# Patient Record
Sex: Female | Born: 1990 | Race: Black or African American | Hispanic: No | Marital: Single | Smoking: Current some day smoker
Health system: Southern US, Community
[De-identification: ages and names within clinical notes are randomized; demographics above are authoritative.]

## PROBLEM LIST (undated history)

## (undated) ENCOUNTER — Inpatient Hospital Stay (HOSPITAL_COMMUNITY): Payer: Self-pay

## (undated) DIAGNOSIS — Z789 Other specified health status: Secondary | ICD-10-CM

## (undated) DIAGNOSIS — B009 Herpesviral infection, unspecified: Secondary | ICD-10-CM

## (undated) DIAGNOSIS — Z3490 Encounter for supervision of normal pregnancy, unspecified, unspecified trimester: Secondary | ICD-10-CM

## (undated) HISTORY — DX: Encounter for supervision of normal pregnancy, unspecified, unspecified trimester: Z34.90

## (undated) HISTORY — PX: WISDOM TOOTH EXTRACTION: SHX21

## (undated) HISTORY — PX: NO PAST SURGERIES: SHX2092

---
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
## 2011-01-06 LAB — URINALYSIS, ROUTINE W REFLEX MICROSCOPIC
Bilirubin Urine: NEGATIVE
Hgb urine dipstick: NEGATIVE
Ketones, ur: NEGATIVE mg/dL
Specific Gravity, Urine: 1.02 (ref 1.005–1.030)
Urobilinogen, UA: 0.2 mg/dL (ref 0.0–1.0)

## 2011-09-27 ENCOUNTER — Emergency Department (HOSPITAL_COMMUNITY)
Admission: EM | Admit: 2011-09-27 | Discharge: 2011-09-27 | Disposition: A | Discharge status: Home / Self Care | Payer: Medicaid Other | Attending: Emergency Medicine | Admitting: Emergency Medicine

## 2011-09-27 ENCOUNTER — Encounter: Payer: Self-pay | Admitting: *Deleted

## 2011-09-27 DIAGNOSIS — J3489 Other specified disorders of nose and nasal sinuses: Secondary | ICD-10-CM | POA: Insufficient documentation

## 2011-09-27 DIAGNOSIS — K089 Disorder of teeth and supporting structures, unspecified: Secondary | ICD-10-CM | POA: Insufficient documentation

## 2011-09-27 DIAGNOSIS — K047 Periapical abscess without sinus: Secondary | ICD-10-CM | POA: Insufficient documentation

## 2011-09-27 DIAGNOSIS — R6884 Jaw pain: Secondary | ICD-10-CM | POA: Insufficient documentation

## 2011-09-27 MED ORDER — PENICILLIN V POTASSIUM 500 MG PO TABS: 500.0000 mg | ORAL_TABLET | Freq: Four times a day (QID) | ORAL | Status: AC

## 2011-09-27 MED ORDER — HYDROCODONE-ACETAMINOPHEN 5-325 MG PO TABS: 1.0000 | ORAL_TABLET | Freq: Four times a day (QID) | ORAL | Status: AC | PRN

## 2011-09-27 MED ORDER — IBUPROFEN 800 MG PO TABS: 800.0000 mg | ORAL_TABLET | Freq: Three times a day (TID) | ORAL | Status: AC

## 2011-09-27 NOTE — ED Provider Notes (Signed)
History     CSN: [id number]  Arrival date & time 09/27/11  5784   First MD Initiated Contact with Patient 09/27/11 [phone]      Chief Complaint  Patient presents with  . Jaw Pain    (Consider location/radiation/quality/duration/timing/severity/associated sxs/prior treatment) HPI Comments: Patient reports pain and swelling in her left jaw that began yesterday.  States pain is exacerbated by open and closing jaw and eating.  Admits to left upper molar pain recently. Associated pain in left ear.  Noted she had nasal congestion last night.  Denies fevers, sore throat, difficulty swallowing or breathing, recent illness, any trauma to the jaw, abnormal nasal discharge.    The history is provided by the patient.    History reviewed. No pertinent past medical history.  History reviewed. No pertinent past surgical history.  No family history on file.  History  Substance Use Topics  . Smoking status: Current Some Day Smoker  . Smokeless tobacco: Not on file  . Alcohol Use: Yes    OB History    Grav Para Term Preterm Abortions TAB SAB Ect Mult Living                  Review of Systems  Constitutional: Negative for fever and appetite change.  HENT: Negative for neck pain and neck stiffness.   Respiratory: Negative for shortness of breath.   All other systems reviewed and are negative.    Allergies  Review of patient's allergies indicates no known allergies.  Home Medications  No current outpatient prescriptions on file.  BP 115/82  Pulse 89  Temp(Src) 98.6 F (37 C) (Oral)  Resp 18  SpO2 100%  Physical Exam  Nursing note and vitals reviewed. Constitutional: She is oriented to person, place, and time. She appears well-developed and well-nourished.  HENT:  Head: Normocephalic and atraumatic.    Right Ear: Tympanic membrane and external ear normal.  Left Ear: Tympanic membrane and external ear normal.  Nose: Mucosal edema present.  Mouth/Throat: Oropharynx is  clear and moist. No oropharyngeal exudate, posterior oropharyngeal edema or tonsillar abscesses.    Neck: Neck supple.  Cardiovascular: Normal rate and regular rhythm.   Pulmonary/Chest: Effort normal and breath sounds normal. No stridor. No respiratory distress. She has no wheezes. She has no rales.  Lymphadenopathy:    She has no cervical adenopathy.  Neurological: She is alert and oriented to person, place, and time.    ED Course  Procedures (including critical care time)  Labs Reviewed - No data to display No results found.   1. Dental abscess       MDM  Patient p/w 2 days of left upper jaw swelling, pain, adjacent to left upper molar that is tender to percussion and cracked. Afebrile, airway patent.  Treatment for dental abscess, instructions given regarding follow up with on-call dentist within 48 hours.  Pt verbalizes understanding.           Dillard Cannon [place], [place] 09/27/11 [phone]

## 2011-09-27 NOTE — ED Notes (Signed)
Discharge instructions given. Pt educated about follow up care and prescriptions. Informed to not drive with narcotics and also to eat with medications.

## 2011-09-27 NOTE — ED Notes (Signed)
C/o pain in left lower jaw onset swelling during the night

## 2011-09-27 NOTE — ED Notes (Addendum)
Pt stated that she has been having left side jaw pain since yesterday. Pain is 8 out of 10. Pain increase when patient is eating or opening/moving mouth.Will continue to monitor.

## 2011-09-28 NOTE — ED Provider Notes (Signed)
Medical screening examination/treatment/procedure(s) were conducted as a shared visit with non-physician practitioner(s) and myself.  I personally evaluated the patient during the encounter   Joya Gaskins, MD 09/28/11 [phone]

## 2012-03-21 ENCOUNTER — Emergency Department (INDEPENDENT_AMBULATORY_CARE_PROVIDER_SITE_OTHER)
Admission: EM | Admit: 2012-03-21 | Discharge: 2012-03-21 | Disposition: A | Discharge status: Home / Self Care | Payer: Self-pay | Source: Home / Self Care | Attending: Family Medicine | Admitting: Family Medicine

## 2012-03-21 ENCOUNTER — Encounter (HOSPITAL_COMMUNITY): Payer: Self-pay | Admitting: *Deleted

## 2012-03-21 DIAGNOSIS — M7650 Patellar tendinitis, unspecified knee: Secondary | ICD-10-CM

## 2012-03-21 DIAGNOSIS — M7652 Patellar tendinitis, left knee: Secondary | ICD-10-CM

## 2012-03-21 MED ORDER — TRAMADOL HCL 50 MG PO TABS: 50.0000 mg | ORAL_TABLET | Freq: Four times a day (QID) | ORAL | Status: AC | PRN

## 2012-03-21 MED ORDER — IBUPROFEN 600 MG PO TABS: 600.0000 mg | ORAL_TABLET | Freq: Three times a day (TID) | ORAL | Status: AC | PRN

## 2012-03-21 NOTE — ED Notes (Signed)
Pt  Reports   About  5  Days  Ago  She  inj  Her     l  Knee       She  Is ambulatory    And     Alert        She     Has  No  Obvious  Deformity      She      denys  Any other  injurys

## 2012-03-21 NOTE — Discharge Instructions (Signed)
Patellar Tendinitis, Jumper's Knee with Rehab Tendinitis is inflammation of a tendon. Tendonitis of the tendon below the kneecap (patella) is known as patellar tendonitis. Patellar tendonitis is a common cause of pain below the kneecap (infrapatella). Patellar tendonitis may involve a tear (strain) in the ligament. Strains are classified into three categories. Grade 1 strains cause pain, but the tendon is not lengthened. Grade 2 strains include a lengthened ligament, due to the ligament being stretched or partially ruptured. With grade 2 strains there is still function, although function may be decreased. Grade 3 strains involve a complete tear of the tendon or muscle, and function is usually impaired. Patellar tendon strains are usually grade 1 or 2.  SYMPTOMS   Pain, tenderness, swelling, warmth, or redness over the patellar tendon (just below the kneecap).   Pain and loss of strength (sometimes), with forcefully straightening the knee (especially when jumping or rising from a seated or squatting position), or bending the knee completely (squatting or kneeling).   Crackling sound (crepitation) when the tendon is moved or touched.  CAUSES  Patellar tendonitis is caused by injury to the patellar tendon. The inflammation is the body's healing response. Common causes of injury include:  Stress from a sudden increase in intensity, frequency, or duration of training.   Overuse of the quadriceps thigh muscles and patellar tendon.   Direct hit (trauma) to the knee or patellar tendon.  RISK INCREASES WITH:  Sports that require sudden, explosive thigh muscle (quadriceps) contraction, such as jumping, quick starts, or kicking.   Running sports, especially running down hills.   Poor strength and flexibility of the thigh and knee.   Flat feet.  PREVENTION  Warm up and stretch properly before activity.   Allow for adequate recovery between workouts.   Maintain physical fitness:   Strength,  flexibility, and endurance.   Cardiovascular fitness.   Protect the knee joint with taping, protective strapping, bracing, or elastic compression bandage.   Wear arch supports (orthotics).  PROGNOSIS  If treated properly, patellar tendonitis usually heals within 6 weeks.  RELATED COMPLICATIONS   Longer healing time, if not properly treated or if not given enough time to heal.   Recurring symptoms, if activity is resumed too soon, with overuse, with a direct blow, or when using poor technique.   If untreated, tendon rupture requiring surgery.  TREATMENT Treatment first involves the use of ice and medicine, to reduce pain and inflammation. The use of strengthening and stretching exercises may help reduce pain with activity. These exercises may be performed at home or with a therapist. Serious cases of tendonitis may require restraining the knee for 10 to 14 days, to prevent stress on the tendon and to promote healing. Crutches may be used (uncommon) until you can walk without a limp. For cases in which non-surgical treatment is unsuccessful, surgery may be advised, to remove the inflamed tendon lining (sheath). Surgery is rare, and is only advised after at least 6 months of non-surgical treatment. MEDICATION   If pain medicine is needed, nonsteroidal anti-inflammatory medicines (aspirin and ibuprofen), or other minor pain relievers (acetaminophen), are often advised.   Do not take pain medicine for 7 days before surgery.   Prescription pain relievers may be given, if your caregiver thinks they are needed. Use only as directed and only as much as you need.  HEAT AND COLD  Cold treatment (icing) should be applied for 10 to 15 minutes every 2 to 3 hours for inflammation and pain, and immediately  after activity that aggravates your symptoms. Use ice packs or an ice massage.   Heat treatment may be used before performing stretching and strengthening activities prescribed by your caregiver,  physical therapist, or athletic trainer. Use a heat pack or a warm water soak.  SEEK MEDICAL CARE IF:  Symptoms get worse or do not improve in 2 weeks, despite treatment.   New, unexplained symptoms develop. (Drugs used in treatment may produce side effects.)  EXERCISES RANGE OF MOTION (ROM) AND STRETCHING EXERCISES - Patellar Tendinitis (Jumper's Knee) These are some of the initial exercises with which you may start your rehabilitation program, until you see your caregiver again or until your symptoms are resolved. Remember:   Flexible tissue is more tolerant of the stresses placed on it during activities.   Each stretch should be held for 20 to 30 seconds.   A gentle stretching sensation should be felt.  STRETCH - Hamstrings, Supine  Lie on your back. Loop a belt or towel over the ball of your right / left foot.   Straighten your right / left knee and slowly pull on the belt to raise your leg. Do not allow the right / left knee to bend. Keep your opposite leg flat on the floor.   Raise the leg until you feel a gentle stretch behind your right / left knee or thigh. Hold this position for _____30_____ seconds.  Repeat ____10______ times. Complete this stretch ____3______ times per day.  STRETCH - Hamstrings, Doorway  Lie on your back with your right / left leg extended and resting on the wall, and the opposite leg flat on the ground through the door. At first, position your bottom farther away from the wall.   Keep your right / left knee straight. If you feel a stretch behind your knee or thigh, hold this position for _______30___ seconds.   If you do not feel a stretch, scoot your bottom closer to the door, and hold __________ seconds.  Repeat ____10______ times. Complete this stretch _____3_____ times per day.  STRETCH - Hamstrings, Standing  Stand or sit and extend your right / left leg, placing your foot on a chair or foot stool.   Keep a slight arch in your low back and your  hips straight forward.   Lead with your chest and lean forward at the waist until you feel a gentle stretch in the back of your right / left knee or thigh. (When done correctly, this exercise requires leaning only a small distance.)   Hold this position for _____30_____ seconds.  Repeat ____10______ times. Complete this stretch _____3_____ times per day. STRETCH - Adductors, Lunge  While standing, spread your legs, with your right / left leg behind you.   Lean away from your right / left leg by bending your opposite knee. You may rest your hands on your thigh for balance.   You should feel a stretch in your right / left inner thigh. Hold for ____30______ seconds.  Repeat ____10______ times. Complete this exercise _____3_____ times per day.  STRENGTHENING EXERCISES - Patellar Tendinitis (Jumper's Knee) These exercises may help you when beginning to rehabilitate your injury. They may resolve your symptoms with or without further involvement from your physician, physical therapist or athletic trainer. While completing these exercises, remember:   Muscles can gain both the endurance and the strength needed for everyday activities through controlled exercises.   Complete these exercises as instructed by your physician, physical therapist or athletic trainer. Increase the resistance  and repetitions only as guided by your caregiver.  STRENGTH - Quadriceps, Isometrics  Lie on your back with your right / left leg extended and your opposite knee bent.   Gradually tense the muscles in the front of your right / left thigh. You should see either your kneecap slide up toward your hip or increased dimpling just above the knee. This motion will push the back of the knee down toward the floor, mat, or bed on which you are lying.   Hold the muscle as tight as you can, without increasing your pain, for _____30_____ seconds.   Relax the muscles slowly and completely in between each repetition.  Repeat  _____10_____ times. Complete this exercise _____3_____ times per day.  STRENGTH - Quadriceps, Short Arcs  Lie on your back. Place a  towel roll under your right / left knee, so that the knee bends slightly.   Raise only your lower leg by tightening the muscles in the front of your thigh. Do not allow your thigh to rise.   Hold this position for ____30______ seconds.  Repeat _____10_____ times. Complete this exercise _____3_____ times per day.  OPTIONAL ANKLE WEIGHTS: Begin with _________5lbs___________, but DO NOT exceed ________20____________. Increase in 1 pound/ 0.5 kilogram increments. STRENGTH - Quadriceps, Straight Leg Raises  Quality counts! Watch for signs that the quadriceps muscle is working, to be sure you are strengthening the correct muscles and not "cheating" by substituting with healthier muscles.  Lay on your back with your right / left leg extended and your opposite knee bent.   Tense the muscles in the front of your right / left thigh. You should see either your kneecap slide up or increased dimpling just above the knee. Your thigh may even shake a bit.   Tighten these muscles even more and raise your leg 4 to 6 inches off the floor. Hold for _____30_____ seconds.   Keeping these muscles tense, lower your leg.   Relax the muscles slowly and completely between each repetition.  Repeat ______10____ times. Complete this exercise _____3_____ times per day.  STRENGTH - Quadriceps, Squats  Stand in a door frame so that your feet and knees are in line with the frame.   Use your hands for balance, not support, on the frame.   Slowly lower your weight, bending at the hips and knees. Keep your lower legs upright so that they are parallel with the door frame. Squat only within the range that does not increase your knee pain. Never let your hips drop below your knees.   Slowly return upright, pushing with your legs, not pulling with your hands.  Repeat ___10_______ times.  Complete this exercise ______3____ times per day.  STRENGTH - Quadriceps, Step-Downs  Stand on the edge of a step stool or stair. Be prepared to use a countertop or wall for balance, if needed.   Keeping your right / left knee directly over the middle of your foot, slowly touch your opposite heel to the floor or lower step. Do not go all the way to the floor if your knee pain increases, just go as far as you can without increased discomfort. Use your right / left leg muscles, not gravity to lower your body weight.   Slowly push your body weight back up to the starting position,  Repeat ___10_______ times. Complete this exercise _____3_____ times per day.  Document Released: 09/13/2005 Document Revised: 09/02/2011 Document Reviewed: 12/26/2008 [facility] Patient Information 2012 [place], [place].

## 2012-03-25 NOTE — ED Provider Notes (Signed)
History     CSN: [id number]  Arrival date & time 03/21/12  1535   First MD Initiated Contact with Patient 03/21/12 1605      Chief Complaint  Patient presents with  . Knee Pain    (Consider location/radiation/quality/duration/timing/severity/associated sxs/prior treatment) HPI Comments: 21 y/o female no significant PMH here c/o pain in left knee worse with knee flexion states she was cleaning the inside of her car and suddenly  turned around hitting her left knee against the card door handle. Has not developed swelling or hematoma but knee hurts every time she bends it. No pain with weight bearing or walking. No taking any medication for her symptoms.    History reviewed. No pertinent past medical history.  History reviewed. No pertinent past surgical history.  History reviewed. No pertinent family history.  History  Substance Use Topics  . Smoking status: Current Some Day Smoker  . Smokeless tobacco: Not on file  . Alcohol Use: Yes    OB History    Grav Para Term Preterm Abortions TAB SAB Ect Mult Living                  Review of Systems  Constitutional: Negative for fever, chills, appetite change and fatigue.       10 systems reviewed and  pertinent negative and positive symptoms are as per HPI.     HENT: Negative for sore throat.   Gastrointestinal: Negative for abdominal pain.  Musculoskeletal: Negative for myalgias, joint swelling and arthralgias.  All other systems reviewed and are negative.    Allergies  Review of patient's allergies indicates no known allergies.  Home Medications   Current Outpatient Rx  Name Route Sig Dispense Refill  . IBUPROFEN 600 MG PO TABS Oral Take 1 tablet (600 mg total) by mouth every 8 (eight) hours as needed for pain. 30 tablet 0  . TRAMADOL HCL 50 MG PO TABS Oral Take 1 tablet (50 mg total) by mouth every 6 (six) hours as needed for pain. 15 tablet 0    BP 106/74  Pulse 84  Temp 98.2 F (36.8 C) (Oral)  Resp 18   SpO2 98%  LMP 03/07/2012  Physical Exam  Nursing note and vitals reviewed. Constitutional: She is oriented to person, place, and time. She appears well-developed and well-nourished. No distress.  HENT:  Head: Normocephalic and atraumatic.  Eyes: Conjunctivae are normal.  Neck: Normal range of motion. Neck supple.  Cardiovascular: Normal heart sounds.   Pulmonary/Chest: Breath sounds normal.  Musculoskeletal:       Left knee: no obvious swelling deformity or erythema. No effusion. Weight bearing. Tender to the patient below the patella over inferior patellar tendon. No patella dislocation. No tenderness, crepitus or bouncing with palpation of the patella itself. Patellar borders feel smooth and nontender. No hyperlaxity. Negative drawer test. Negative McMurray test.  Lymphadenopathy:    She has no cervical adenopathy.  Neurological: She is alert and oriented to person, place, and time.  Skin:       No bruising, abrasions or lacerations.    ED Course  Procedures (including critical care time)  Labs Reviewed - No data to display No results found.   1. Patellar tendinitis of left knee       MDM  Impress tendinitis. Treated symptomatically with tramadol and ibuprofen. Knees sleave was placed here. Knee exercises hand out provided. Asked to return or follow up with ortho specialist if persistent symptoms after 1 week or earlier if worsening  symptoms despite treatment.        Sharin Grave, MD 03/25/12 1626

## 2012-11-28 ENCOUNTER — Encounter (HOSPITAL_COMMUNITY): Payer: Self-pay | Admitting: *Deleted

## 2012-11-28 ENCOUNTER — Inpatient Hospital Stay (HOSPITAL_COMMUNITY)
Admission: AD | Admit: 2012-11-28 | Discharge: 2012-11-28 | Disposition: A | Discharge status: Hospice | Payer: Self-pay | Source: Ambulatory Visit | Attending: Family Medicine | Admitting: Family Medicine

## 2012-11-28 DIAGNOSIS — M545 Low back pain, unspecified: Secondary | ICD-10-CM | POA: Insufficient documentation

## 2012-11-28 DIAGNOSIS — N390 Urinary tract infection, site not specified: Secondary | ICD-10-CM | POA: Insufficient documentation

## 2012-11-28 DIAGNOSIS — N949 Unspecified condition associated with female genital organs and menstrual cycle: Secondary | ICD-10-CM | POA: Insufficient documentation

## 2012-11-28 DIAGNOSIS — R109 Unspecified abdominal pain: Secondary | ICD-10-CM | POA: Insufficient documentation

## 2012-11-28 DIAGNOSIS — Z3202 Encounter for pregnancy test, result negative: Secondary | ICD-10-CM

## 2012-11-28 DIAGNOSIS — N938 Other specified abnormal uterine and vaginal bleeding: Secondary | ICD-10-CM | POA: Insufficient documentation

## 2012-11-28 HISTORY — DX: Other specified health status: Z78.9

## 2012-11-28 LAB — URINALYSIS, ROUTINE W REFLEX MICROSCOPIC
Bilirubin Urine: NEGATIVE
Glucose, UA: NEGATIVE mg/dL
Protein, ur: NEGATIVE mg/dL
Specific Gravity, Urine: >1.03 — ABNORMAL HIGH (ref 1.005–1.030)

## 2012-11-28 LAB — POCT PREGNANCY, URINE: Preg Test, Ur: NEGATIVE

## 2012-11-28 LAB — URINE MICROSCOPIC-ADD ON

## 2012-11-28 MED ORDER — CIPROFLOXACIN HCL 500 MG PO TABS: 500.0000 mg | ORAL_TABLET | Freq: Two times a day (BID) | ORAL | Status: DC

## 2012-11-28 NOTE — MAU Provider Note (Signed)
Chief Complaint: Abdominal Pain and Vaginal Bleeding   First Provider Initiated Contact with Patient 11/28/12 0124     SUBJECTIVE HPI: Kim Stanton is a 22 y.o. G0P0 w/ Implanon in place x 2 years who presents with spotting x 1 day and low back cramping that resolved spotaneously. Pt is concerned that she may be pregnant. Denies fever, chills, abd pain, vaginal discharge, urinary complaints, flank pain or pain w/ IC.   Past Medical History  Diagnosis Date  . Medical history non-contributory    OB History   Grav Para Term Preterm Abortions TAB SAB Ect Mult Living   0              Past Surgical History  Procedure Laterality Date  . No past surgeries     History   Social History  . Marital Status: Single    Spouse Name: N/A    Number of Children: N/A  . Years of Education: N/A   Occupational History  . Not on file.   Social History Main Topics  . Smoking status: Current Some Day Smoker    Types: Cigarettes  . Smokeless tobacco: Not on file  . Alcohol Use: 1.2 oz/week    2 Shots of liquor per week     Comment: yesterday  . Drug Use: Yes    Special: Marijuana     Comment: two days ago  . Sexually Active: Yes    Birth Control/ Protection: Implant   Other Topics Concern  . Not on file   Social History Narrative  . No narrative on file   No current facility-administered medications on file prior to encounter.   No current outpatient prescriptions on file prior to encounter.   No Known Allergies  ROS: Pertinent items in HPI  OBJECTIVE Blood pressure 107/71, pulse 84, temperature 97.7 F (36.5 C), temperature source Oral, resp. rate 16, last menstrual period 11/27/2012. GENERAL: Well-developed, well-nourished female in no acute distress.  HEENT: Normocephalic HEART: normal rate RESP: normal effort ABDOMEN: Soft, non-tender BACK: No CVAT EXTREMITIES: Nontender, no edema NEURO: Alert and oriented SPECULUM EXAM: Deferred  LAB RESULTS Results for orders  placed during the hospital encounter of 11/28/12 (from the past 24 hour(s))  URINALYSIS, ROUTINE W REFLEX MICROSCOPIC     Status: Abnormal   Collection Time    11/28/12 12:35 AM      Result Value Range   Color, Urine YELLOW  YELLOW   APPearance CLEAR  CLEAR   Specific Gravity, Urine >1.030 (*) 1.005 - 1.030   pH 6.0  5.0 - 8.0   Glucose, UA NEGATIVE  NEGATIVE mg/dL   Hgb urine dipstick SMALL (*) NEGATIVE   Bilirubin Urine NEGATIVE  NEGATIVE   Ketones, ur NEGATIVE  NEGATIVE mg/dL   Protein, ur NEGATIVE  NEGATIVE mg/dL   Urobilinogen, UA 0.2  0.0 - 1.0 mg/dL   Nitrite POSITIVE (*) NEGATIVE   Leukocytes, UA NEGATIVE  NEGATIVE  URINE MICROSCOPIC-ADD ON     Status: Abnormal   Collection Time    11/28/12 12:35 AM      Result Value Range   Squamous Epithelial / LPF RARE  RARE   WBC, UA 0-2  <3 WBC/hpf   RBC / HPF 0-2  <3 RBC/hpf   Bacteria, UA FEW (*) RARE   Urine-Other MUCOUS PRESENT    POCT PREGNANCY, URINE     Status: None   Collection Time    11/28/12 12:41 AM      Result Value Range  Preg Test, Ur NEGATIVE  NEGATIVE    IMAGING No results found.  MAU COURSE  ASSESSMENT 1. Pregnancy test negative   2. UTI (urinary tract infection)     PLAN Discharge home     Follow-up Information   Call [facility]. (to schedule an appointment)    Contact information:   [address] [place] [address] [phone]       Medication List    TAKE these medications       ciprofloxacin 500 MG tablet  Commonly known as:  CIPRO  Take 1 tablet (500 mg total) by mouth 2 (two) times daily.         [place], CNM 11/28/2012  1:55 AM

## 2012-11-28 NOTE — MAU Provider Note (Signed)
Chart reviewed and agree with management and plan.  

## 2012-11-28 NOTE — MAU Note (Signed)
Pt c/o pink spotting when she wipes all day today, and cramping pain in her low back that started today as well, took HPT and they were negative.

## 2012-12-27 ENCOUNTER — Encounter (HOSPITAL_COMMUNITY): Payer: Self-pay | Admitting: Emergency Medicine

## 2012-12-27 ENCOUNTER — Emergency Department (HOSPITAL_COMMUNITY)
Admission: EM | Admit: 2012-12-27 | Discharge: 2012-12-27 | Disposition: A | Discharge status: Home / Self Care | Payer: Self-pay | Attending: Emergency Medicine | Admitting: Emergency Medicine

## 2012-12-27 ENCOUNTER — Emergency Department (HOSPITAL_COMMUNITY): Payer: Self-pay

## 2012-12-27 DIAGNOSIS — R079 Chest pain, unspecified: Secondary | ICD-10-CM | POA: Insufficient documentation

## 2012-12-27 DIAGNOSIS — R002 Palpitations: Secondary | ICD-10-CM | POA: Insufficient documentation

## 2012-12-27 DIAGNOSIS — Z3202 Encounter for pregnancy test, result negative: Secondary | ICD-10-CM | POA: Insufficient documentation

## 2012-12-27 DIAGNOSIS — R0989 Other specified symptoms and signs involving the circulatory and respiratory systems: Secondary | ICD-10-CM | POA: Insufficient documentation

## 2012-12-27 DIAGNOSIS — F17200 Nicotine dependence, unspecified, uncomplicated: Secondary | ICD-10-CM | POA: Insufficient documentation

## 2012-12-27 DIAGNOSIS — R0609 Other forms of dyspnea: Secondary | ICD-10-CM | POA: Insufficient documentation

## 2012-12-27 DIAGNOSIS — Z79899 Other long term (current) drug therapy: Secondary | ICD-10-CM | POA: Insufficient documentation

## 2012-12-27 LAB — CBC WITH DIFFERENTIAL/PLATELET
Basophils Absolute: 0 10*3/uL (ref 0.0–0.1)
Basophils Relative: 1 % (ref 0–1)
Eosinophils Absolute: 0.1 10*3/uL (ref 0.0–0.7)
Eosinophils Relative: 3 % (ref 0–5)
Hemoglobin: 13.7 g/dL (ref 12.0–15.0)
MCH: 30.4 pg (ref 26.0–34.0)
Monocytes Absolute: 0.4 10*3/uL (ref 0.1–1.0)
Neutro Abs: 1.6 10*3/uL — ABNORMAL LOW (ref 1.7–7.7)
Neutrophils Relative %: 35 % — ABNORMAL LOW (ref 43–77)
Platelets: 296 10*3/uL (ref 150–400)
RBC: 4.5 MIL/uL (ref 3.87–5.11)
WBC: 4.4 10*3/uL (ref 4.0–10.5)

## 2012-12-27 LAB — POCT I-STAT, CHEM 8
Calcium, Ion: 1.23 mmol/L (ref 1.12–1.23)
HCT: 42 % (ref 36.0–46.0)
TCO2: 27 mmol/L (ref 0–100)

## 2012-12-27 LAB — POCT PREGNANCY, URINE: Preg Test, Ur: NEGATIVE

## 2012-12-27 MED ORDER — KETOROLAC TROMETHAMINE 30 MG/ML IJ SOLN
30.0000 mg | Freq: Once | INTRAMUSCULAR | Status: DC
  Filled 2012-12-27: qty 1

## 2012-12-27 NOTE — ED Notes (Signed)
Pt c/o CP and dizziness that started at 8:00 am today and lasted 45 minutes, states "I must of slept wrong".  Denies any pain at present but complains of still being lightheaded.

## 2012-12-27 NOTE — ED Provider Notes (Signed)
History     CSN: [id number]  Arrival date & time 12/27/12  0847   First MD Initiated Contact with Patient 12/27/12 [phone]      Chief Complaint  Patient presents with  . Dizziness    (Consider location/radiation/quality/duration/timing/severity/associated sxs/prior treatment) HPI  Patient presents with chest pain.  She states that she woke up, several hours prior to arrival with palpitations, chest pain.  She does endorse mild lightheadedness, no syncope, no dyspnea. Since onset the dyspnea, lightheadedness have improved.  There is now only focal pain about the left superior parasternal area. She generally healthy, denies any chronic medical conditions, a medication, any herbal supplements. She smokes occasionally, drinks occasionally. She takes birth control. The patient is here with family members who deny any genetic significant disease, including no early cardiac death.   Past Medical History  Diagnosis Date  . Medical history non-contributory     Past Surgical History  Procedure Laterality Date  . No past surgeries      Family History  Problem Relation Age of Onset  . Cancer Maternal Grandmother   . Diabetes Maternal Grandmother     History  Substance Use Topics  . Smoking status: Current Some Day Smoker    Types: Cigarettes  . Smokeless tobacco: Not on file  . Alcohol Use: 1.2 oz/week    2 Shots of liquor per week     Comment: yesterday    OB History   Grav Para Term Preterm Abortions TAB SAB Ect Mult Living   0               Review of Systems  All other systems reviewed and are negative.    Allergies  Lactose intolerance (gi)  Home Medications   Current Outpatient Rx  Name  Route  Sig  Dispense  Refill  . Biotin 5000 MCG TABS   Oral   Take 5,000 mcg by mouth daily.         . COCONUT OIL PO   Oral   Take 1 tablet by mouth daily.           BP 116/71  Pulse 88  Temp(Src) 98.1 F (36.7 C)  Resp 16  SpO2 100%  LMP  12/14/2012  Physical Exam  Nursing note and vitals reviewed. Constitutional: She is oriented to person, place, and time. She appears well-developed and well-nourished. No distress.  HENT:  Head: Normocephalic and atraumatic.  Eyes: Conjunctivae and EOM are normal.  Cardiovascular: Normal rate and regular rhythm.   Pulmonary/Chest: Effort normal and breath sounds normal. No stridor. No respiratory distress.    Abdominal: She exhibits no distension.  Musculoskeletal: She exhibits no edema.  Neurological: She is alert and oriented to person, place, and time. No cranial nerve deficit.  Skin: Skin is warm and dry.  Psychiatric: She has a normal mood and affect.    ED Course  Procedures (including critical care time)  Labs Reviewed  CBC WITH DIFFERENTIAL - Abnormal; Notable for the following:    Neutrophils Relative 35 (*)    Neutro Abs 1.6 (*)    Lymphocytes Relative 53 (*)    All other components within normal limits  POCT I-STAT, CHEM 8  POCT PREGNANCY, URINE   Dg Chest 2 View  12/27/2012  *RADIOLOGY REPORT*  Clinical Data: Chest pain  CHEST - 2 VIEW  Comparison: None.  Findings: The lungs are clear without focal consolidation, edema, effusion or pneumothorax.  Cardiopericardial silhouette is within normal limits for size.  Imaged bony structures of the thorax are intact. Radiopaque foreign bodies over the midline anteriorly are presumably related to sternal jewelry.  IMPRESSION: Normal exam.   Original Report Authenticated By: Kennith Center, M.D.      1. Chest pain      Date: 12/27/2012  Rate: 87  Rhythm: normal sinus rhythm  QRS Axis: normal  Intervals: normal  ST/T Wave abnormalities: normal  Conduction Disutrbances: none  Narrative Interpretation: unremarkable   O2- 99%ra, normal   Wells criteria is reassuring for low suspicion of PE  MDM  This generally well-appearing female presents with several hours of palpitations, chest discomfort.  The patient has minimal  risk factors for PE, and is not hypoxic, nor tachypneic.  The reversed pain, reassuring labs and ECG suggest musculoskeletal etiology, though GI etiology is considered.  We discussed her initial course of anti-inflammatories, with reconsideration for your primary care patient does not improve in several days.  Absent distress, she was discharged in stable condition.      Gerhard Munch, MD 12/27/12 1247

## 2012-12-27 NOTE — ED Notes (Signed)
Pt. Stated, when i woke up this morning my mid chest was hurting and I could feel my heart beating really fast.  Then when I started to get up I felt light headed. Pain is slightly gone now.

## 2014-02-15 ENCOUNTER — Emergency Department (HOSPITAL_COMMUNITY)
Admission: EM | Admit: 2014-02-15 | Discharge: 2014-02-15 | Disposition: A | Discharge status: Home / Self Care | Payer: No Typology Code available for payment source | Attending: Emergency Medicine | Admitting: Emergency Medicine

## 2014-02-15 ENCOUNTER — Emergency Department (HOSPITAL_COMMUNITY): Payer: No Typology Code available for payment source

## 2014-02-15 ENCOUNTER — Encounter (HOSPITAL_COMMUNITY): Payer: Self-pay | Admitting: Emergency Medicine

## 2014-02-15 DIAGNOSIS — Y9289 Other specified places as the place of occurrence of the external cause: Secondary | ICD-10-CM | POA: Insufficient documentation

## 2014-02-15 DIAGNOSIS — Y9389 Activity, other specified: Secondary | ICD-10-CM | POA: Insufficient documentation

## 2014-02-15 DIAGNOSIS — S298XXA Other specified injuries of thorax, initial encounter: Secondary | ICD-10-CM | POA: Insufficient documentation

## 2014-02-15 DIAGNOSIS — R0789 Other chest pain: Secondary | ICD-10-CM

## 2014-02-15 DIAGNOSIS — F17200 Nicotine dependence, unspecified, uncomplicated: Secondary | ICD-10-CM | POA: Insufficient documentation

## 2014-02-15 DIAGNOSIS — Z79899 Other long term (current) drug therapy: Secondary | ICD-10-CM | POA: Insufficient documentation

## 2014-02-15 MED ORDER — IBUPROFEN 800 MG PO TABS: 800.0000 mg | ORAL_TABLET | Freq: Three times a day (TID) | ORAL | Status: DC
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
## 2014-02-15 NOTE — ED Provider Notes (Signed)
Medical screening examination/treatment/procedure(s) were performed by non-physician practitioner and as supervising physician I was immediately available for consultation/collaboration.   Toy Baker, MD 02/15/14 [phone]

## 2014-02-15 NOTE — Discharge Instructions (Signed)
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
## 2016-07-09 ENCOUNTER — Encounter (HOSPITAL_COMMUNITY): Payer: Self-pay

## 2016-07-09 ENCOUNTER — Emergency Department (HOSPITAL_COMMUNITY)
Admission: EM | Admit: 2016-07-09 | Discharge: 2016-07-09 | Disposition: A | Discharge status: Home / Self Care | Payer: Self-pay | Attending: Emergency Medicine | Admitting: Emergency Medicine

## 2016-07-09 DIAGNOSIS — R6889 Other general symptoms and signs: Secondary | ICD-10-CM

## 2016-07-09 DIAGNOSIS — R1110 Vomiting, unspecified: Secondary | ICD-10-CM | POA: Insufficient documentation

## 2016-07-09 DIAGNOSIS — F17210 Nicotine dependence, cigarettes, uncomplicated: Secondary | ICD-10-CM | POA: Insufficient documentation

## 2016-07-09 DIAGNOSIS — R52 Pain, unspecified: Secondary | ICD-10-CM | POA: Insufficient documentation

## 2016-07-09 LAB — COMPREHENSIVE METABOLIC PANEL
ALT: 24 U/L (ref 14–54)
ANION GAP: 12 (ref 5–15)
AST: 28 U/L (ref 15–41)
Albumin: 4.5 g/dL (ref 3.5–5.0)
Alkaline Phosphatase: 58 U/L (ref 38–126)
BILIRUBIN TOTAL: 0.6 mg/dL (ref 0.3–1.2)
CO2: 21 mmol/L — ABNORMAL LOW (ref 22–32)
Calcium: 9.7 mg/dL (ref 8.9–10.3)
Chloride: 110 mmol/L (ref 101–111)
Creatinine, Ser: 0.78 mg/dL (ref 0.44–1.00)
Glucose, Bld: 80 mg/dL (ref 65–99)
POTASSIUM: 3.7 mmol/L (ref 3.5–5.1)
Sodium: 143 mmol/L (ref 135–145)
TOTAL PROTEIN: 7.2 g/dL (ref 6.5–8.1)

## 2016-07-09 LAB — CBC
HEMATOCRIT: 43.3 % (ref 36.0–46.0)
HEMOGLOBIN: 15.4 g/dL — AB (ref 12.0–15.0)
MCH: 32.2 pg (ref 26.0–34.0)
MCHC: 35.6 g/dL (ref 30.0–36.0)
MCV: 90.4 fL (ref 78.0–100.0)
Platelets: 337 10*3/uL (ref 150–400)
RBC: 4.79 MIL/uL (ref 3.87–5.11)
RDW: 12.8 % (ref 11.5–15.5)
WBC: 3.4 10*3/uL — AB (ref 4.0–10.5)

## 2016-07-09 LAB — URINALYSIS, ROUTINE W REFLEX MICROSCOPIC
Bilirubin Urine: NEGATIVE
Glucose, UA: NEGATIVE mg/dL
Hgb urine dipstick: NEGATIVE
KETONES UR: NEGATIVE mg/dL
LEUKOCYTES UA: NEGATIVE
NITRITE: NEGATIVE
PH: 8.5 — AB (ref 5.0–8.0)
Protein, ur: NEGATIVE mg/dL
Specific Gravity, Urine: 1.023 (ref 1.005–1.030)

## 2016-07-09 LAB — LIPASE, BLOOD: Lipase: 26 U/L (ref 11–51)

## 2016-07-09 MED ORDER — ONDANSETRON HCL 4 MG PO TABS: 4.0000 mg | ORAL_TABLET | Freq: Three times a day (TID) | ORAL | 0 refills | Status: DC | PRN

## 2016-07-09 MED ORDER — ONDANSETRON HCL 4 MG/2ML IJ SOLN
4.0000 mg | Freq: Once | INTRAMUSCULAR | Status: AC
  Administered 2016-07-09: 4 mg via INTRAVENOUS
  Filled 2016-07-09: qty 2

## 2016-07-09 MED ORDER — SODIUM CHLORIDE 0.9 % IV BOLUS (SEPSIS)
1000.0000 mL | Freq: Once | INTRAVENOUS | Status: AC
  Administered 2016-07-09: 1000 mL via INTRAVENOUS

## 2016-07-09 MED ORDER — ACETAMINOPHEN 325 MG PO TABS
650.0000 mg | ORAL_TABLET | Freq: Once | ORAL | Status: AC
  Administered 2016-07-09: 650 mg via ORAL
  Filled 2016-07-09: qty 2

## 2016-07-09 NOTE — ED Notes (Signed)
Pt c/o flu like s/s x 2 days. Emesis x 1 today with decreased appetite and generalized body aches. Pt reports temp of 103 this am. No apap/motrin since this morning.  IV est, IVF started, pt medicated per MD order.  Family at bedside.

## 2016-07-09 NOTE — ED Triage Notes (Signed)
Per Pt, Pt had flu shot a few days ago. About two days ago, she noticed a fever and some nausea with vomiting. Complains of overall body aches.

## 2016-07-09 NOTE — ED Provider Notes (Signed)
[facility] Provider Note   CSN: [id number] Arrival date & time: 07/09/16  1256     History   Chief Complaint Chief Complaint  Patient presents with  . Generalized Body Aches  . Vomiting    HPI Kim Stanton is a 25 y.o. female.  HPI   Generally healthy 25 year old female presents with flulike symptoms. Patient report 2 days ago she received a flu shot. The next day she reported having generalized body aches, myalgias, rhinorrhea, occasional cough, feeling nauseous, decreased appetite, and generalized fatigue. States she vomited once this morning. She tries taking TheraFlu with minimal improvement. She denies fever but endorsed chills. She denies any severe headache, hearing changes, ear pain, sore throat, productive cough, hemoptysis, abd pain, diarrhea, dysuria, or rash. No recent travel. Patient unable to recall last menstrual period due to being on birth control Nexplanon.   Past Medical History:  Diagnosis Date  . Medical history non-contributory     There are no active problems to display for this patient.   Past Surgical History:  Procedure Laterality Date  . NO PAST SURGERIES      OB History    Gravida Para Term Preterm AB Living   0             SAB TAB Ectopic Multiple Live Births                   Home Medications    Prior to Admission medications   Medication Sig Start Date End Date Taking? Authorizing Provider  Biotin 5000 MCG TABS Take 5,000 mcg by mouth daily.    Historical Provider, MD  COCONUT OIL PO Take 1 tablet by mouth daily.    Historical Provider, MD  ibuprofen (ADVIL,MOTRIN) 800 MG tablet Take 1 tablet (800 mg total) by mouth 3 (three) times daily. 02/15/14   Teressa LowerVrinda Pickering, NP    Family History Family History  Problem Relation Age of Onset  . Cancer Maternal Grandmother   . Diabetes Maternal Grandmother     Social History Social History  Substance Use Topics  . Smoking status: Current Some Day Smoker    Types:  Cigarettes  . Smokeless tobacco: Never Used  . Alcohol use 1.2 oz/week    2 Shots of liquor per week     Comment: yesterday     Allergies   Lactose intolerance (gi)   Review of Systems Review of Systems  All other systems reviewed and are negative.    Physical Exam Updated Vital Signs BP 108/69   Pulse 72   Temp 98.2 F (36.8 C) (Oral)   Resp 16   Ht 5\' 2"  (1.575 m)   Wt 70.3 kg   SpO2 97%   BMI 28.35 kg/m   Physical Exam  Constitutional: She appears well-developed and well-nourished. No distress.  HENT:  Head: Atraumatic.  Right Ear: External ear normal.  Left Ear: External ear normal.  Nose: Nose normal.  Mouth/Throat: Oropharynx is clear and moist.  Eyes: Conjunctivae are normal.  Neck: Neck supple.  No nuchal rigidity  Cardiovascular: Normal rate and regular rhythm.   Pulmonary/Chest: Effort normal and breath sounds normal. No respiratory distress. She has no wheezes.  Abdominal: Soft. She exhibits no distension. There is no tenderness.  Neurological: She is alert.  Skin: No rash noted.  Psychiatric: She has a normal mood and affect.  Nursing note and vitals reviewed.    ED Treatments / Results  Labs (all labs ordered are listed, but only  abnormal results are displayed) Labs Reviewed  COMPREHENSIVE METABOLIC PANEL - Abnormal; Notable for the following:       Result Value   CO2 21 (*)    BUN <5 (*)    All other components within normal limits  CBC - Abnormal; Notable for the following:    WBC 3.4 (*)    Hemoglobin 15.4 (*)    All other components within normal limits  URINALYSIS, ROUTINE W REFLEX MICROSCOPIC (NOT AT [facility]) - Abnormal; Notable for the following:    Color, Urine AMBER (*)    pH 8.5 (*)    All other components within normal limits  LIPASE, BLOOD    EKG  EKG Interpretation None       Radiology No results found.  Procedures Procedures (including critical care time)  Medications Ordered in ED Medications - No data to  display   Initial Impression / Assessment and Plan / ED Course  I have reviewed the triage vital signs and the nursing notes.  Pertinent labs & imaging results that were available during my care of the patient were reviewed by me and considered in my medical decision making (see chart for details).  Clinical Course    BP 102/65   Pulse 66   Temp 98.2 F (36.8 C) (Oral)   Resp 16   Ht 5\' 2"  (1.575 m)   Wt 70.3 kg   SpO2 100%   BMI 28.35 kg/m    Final Clinical Impressions(s) / ED Diagnoses   Final diagnoses:  Flu-like symptoms    New Prescriptions New Prescriptions   ONDANSETRON (ZOFRAN) 4 MG TABLET    Take 1 tablet (4 mg total) by mouth every 8 (eight) hours as needed for nausea or vomiting.   8:19 PM Patient here with flulike symptoms after receiving flu shot. She is well-appearing, vital signs stable, afebrile. She does endorse some nausea and decrease in appetite. IV fluid and antinausea medication given, Tylenol given for her body aches. I have low suspicion for acute emergent pathology. She has a benign abdomen on exam. Anticipate discharge.    Fayrene Helper, PA-C 07/09/16 1610    Raeford Razor, MD 07/14/16 [phone]

## 2017-07-17 ENCOUNTER — Ambulatory Visit (HOSPITAL_COMMUNITY)
Admission: EM | Admit: 2017-07-17 | Discharge: 2017-07-17 | Disposition: A | Discharge status: Home / Self Care | Payer: Self-pay

## 2017-07-17 ENCOUNTER — Encounter (HOSPITAL_COMMUNITY): Payer: Self-pay | Admitting: Emergency Medicine

## 2017-07-17 DIAGNOSIS — B349 Viral infection, unspecified: Secondary | ICD-10-CM

## 2017-07-17 NOTE — ED Triage Notes (Signed)
On Friday pt c/o body aches, felt nausea. Pt states she feels much better today.

## 2017-07-17 NOTE — ED Provider Notes (Signed)
[facility]    CSN: [id number] Arrival date & time: 07/17/17  4098     History   Chief Complaint Chief Complaint  Patient presents with  . Generalized Body Aches    HPI Kim Stanton is a 26 y.o. female.   Kim Stanton presents with complaints of nausea, diarrhea and body aches which started two nights ago. She feels she has generally improved and no longer has body aches. Mild nausea. Has had 1 episode of diarrhea. No known ill contacts. No suspicious food intake. No vomiting or fever. She is lactose intolerant.       Past Medical History:  Diagnosis Date  . Medical history non-contributory     There are no active problems to display for this patient.   Past Surgical History:  Procedure Laterality Date  . NO PAST SURGERIES      OB History    Gravida Para Term Preterm AB Living   0             SAB TAB Ectopic Multiple Live Births                   Home Medications    Prior to Admission medications   Medication Sig Start Date End Date Taking? Authorizing Provider  ibuprofen (ADVIL,MOTRIN) 800 MG tablet Take 1 tablet (800 mg total) by mouth 3 (three) times daily. Patient not taking: Reported on 07/09/2016 02/15/14   Teressa Lower, NP  ondansetron (ZOFRAN) 4 MG tablet Take 1 tablet (4 mg total) by mouth every 8 (eight) hours as needed for nausea or vomiting. 07/09/16   Fayrene Helper, PA-C    Family History Family History  Problem Relation Age of Onset  . Cancer Maternal Grandmother   . Diabetes Maternal Grandmother     Social History Social History  Substance Use Topics  . Smoking status: Current Some Day Smoker    Types: Cigarettes  . Smokeless tobacco: Never Used  . Alcohol use 1.2 oz/week    2 Shots of liquor per week     Comment: yesterday     Allergies   Lactose intolerance (gi)   Review of Systems Review of Systems  Constitutional: Negative.   HENT: Negative.   Respiratory: Negative.   Gastrointestinal: Positive for diarrhea  and nausea. Negative for abdominal distention, abdominal pain, blood in stool, constipation, rectal pain and vomiting.  Genitourinary: Negative.   Musculoskeletal: Positive for myalgias.  Neurological: Negative.      Physical Exam Triage Vital Signs ED Triage Vitals  Enc Vitals Group     BP 07/17/17 1840 118/76     Pulse Rate 07/17/17 1840 77     Resp 07/17/17 1840 16     Temp 07/17/17 1840 98.4 F (36.9 C)     Temp Source 07/17/17 1840 Oral     SpO2 07/17/17 1840 100 %     Weight --      Height --      Head Circumference --      Peak Flow --      Pain Score 07/17/17 1843 0     Pain Loc --      Pain Edu? --      Excl. in GC? --    No data found.   Updated Vital Signs BP 118/76   Pulse 77   Temp 98.4 F (36.9 C) (Oral)   Resp 16   SpO2 100%   Visual Acuity Right Eye Distance:   Left Eye Distance:  Bilateral Distance:    Right Eye Near:   Left Eye Near:    Bilateral Near:     Physical Exam  Constitutional: She is oriented to person, place, and time. She appears well-developed and well-nourished. No distress.  Eyes: Pupils are equal, round, and reactive to light.  Cardiovascular: Normal rate, regular rhythm and normal heart sounds.   Pulmonary/Chest: Effort normal and breath sounds normal.  Abdominal: Soft. There is no tenderness.  Neurological: She is alert and oriented to person, place, and time.  Skin: Skin is warm and dry.     UC Treatments / Results  Labs (all labs ordered are listed, but only abnormal results are displayed) Labs Reviewed - No data to display  EKG  EKG Interpretation None       Radiology No results found.  Procedures Procedures (including critical care time)  Medications Ordered in UC Medications - No data to display   Initial Impression / Assessment and Plan / UC Course  I have reviewed the triage vital signs and the nursing notes.  Pertinent labs & imaging results that were available during my care of the patient  were reviewed by me and considered in my medical decision making (see chart for details).     Patient non toxic without acute distress, without findings on exam. Vitals stable. She is improving. Discussed that likely viral in nature. Continue to push fluids, rest, bland diet as tolerated. Patient requests work note for [employer], provided. Verbalized understanding of instructions and agreeable to plan. To return to clinic if symptoms worsen or do not improve, recommended establishing with PCP.   Final Clinical Impressions(s) / UC Diagnoses   Final diagnoses:  Viral syndrome    New Prescriptions Discharge Medication List as of 07/17/2017  8:30 PM       Controlled Substance Prescriptions [place] Controlled Substance Registry consulted? Not Applicable   Georgetta HaberBurky, Natalie B, NP 07/17/17 2051

## 2018-04-12 ENCOUNTER — Encounter: Payer: Self-pay | Admitting: Obstetrics

## 2018-04-12 ENCOUNTER — Ambulatory Visit (INDEPENDENT_AMBULATORY_CARE_PROVIDER_SITE_OTHER): Payer: Managed Care, Other (non HMO) | Admitting: Obstetrics

## 2018-04-12 VITALS — BP 115/70 | HR 87 | Ht 62.0 in | Wt 162.8 lb

## 2018-04-12 DIAGNOSIS — Z3009 Encounter for other general counseling and advice on contraception: Secondary | ICD-10-CM

## 2018-04-12 DIAGNOSIS — B373 Candidiasis of vulva and vagina: Secondary | ICD-10-CM

## 2018-04-12 DIAGNOSIS — Z124 Encounter for screening for malignant neoplasm of cervix: Secondary | ICD-10-CM | POA: Diagnosis not present

## 2018-04-12 DIAGNOSIS — Z30011 Encounter for initial prescription of contraceptive pills: Secondary | ICD-10-CM

## 2018-04-12 DIAGNOSIS — N898 Other specified noninflammatory disorders of vagina: Secondary | ICD-10-CM | POA: Diagnosis not present

## 2018-04-12 DIAGNOSIS — Z0000 Encounter for general adult medical examination without abnormal findings: Secondary | ICD-10-CM

## 2018-04-12 DIAGNOSIS — Z113 Encounter for screening for infections with a predominantly sexual mode of transmission: Secondary | ICD-10-CM | POA: Diagnosis not present

## 2018-04-12 DIAGNOSIS — Z01419 Encounter for gynecological examination (general) (routine) without abnormal findings: Secondary | ICD-10-CM

## 2018-04-12 DIAGNOSIS — B3731 Acute candidiasis of vulva and vagina: Secondary | ICD-10-CM

## 2018-04-12 MED ORDER — LO LOESTRIN FE 1 MG-10 MCG / 10 MCG PO TABS: 1.0000 | ORAL_TABLET | Freq: Every day | ORAL | 4 refills | Status: DC

## 2018-04-12 MED ORDER — FLUCONAZOLE 150 MG PO TABS
150.0000 mg | ORAL_TABLET | Freq: Once | ORAL | 5 refills | Status: AC
Start: 2018-04-12 — End: 2018-04-12

## 2018-04-12 NOTE — Progress Notes (Signed)
Subjective:        Kim Stanton is a 27 y.o. female here for a routine exam.  Current complaints: Recurrent yeast infections.    Personal health questionnaire:  Is patient Ashkenazi Jewish, have a family history of breast and/or ovarian cancer: no Is there a family history of uterine cancer diagnosed at age < 4250, gastrointestinal cancer, urinary tract cancer, family member who is a Personnel officerLynch syndrome-associated carrier: no Is the patient overweight and hypertensive, family history of diabetes, personal history of gestational diabetes, preeclampsia or PCOS: no Is patient over 7755, have PCOS,  family history of premature CHD under age 26, diabetes, smoke, have hypertension or peripheral artery disease:  no At any time, has a partner hit, kicked or otherwise hurt or frightened you?: no Over the past 2 weeks, have you felt down, depressed or hopeless?: no Over the past 2 weeks, have you felt little interest or pleasure in doing things?:no   Gynecologic History No LMP recorded (approximate). Patient has had an implant. Contraception: Nexplanon Last Pap: 2018. Results were: normal Last mammogram: n/a. Results were: n/a  Obstetric History OB History  Gravida Para Term Preterm AB Living  0            SAB TAB Ectopic Multiple Live Births               Past Medical History:  Diagnosis Date  . Medical history non-contributory     Past Surgical History:  Procedure Laterality Date  . NO PAST SURGERIES       Current Outpatient Medications:  .  Etonogestrel (NEXPLANON [place]), Inject into the skin., Disp: , Rfl:  .  fluconazole (DIFLUCAN) 150 MG tablet, Take 1 tablet (150 mg total) by mouth once for 1 dose., Disp: 1 tablet, Rfl: 5 .  ibuprofen (ADVIL,MOTRIN) 800 MG tablet, Take 1 tablet (800 mg total) by mouth 3 (three) times daily. (Patient not taking: Reported on 07/09/2016), Disp: 21 tablet, Rfl: 0 .  LO LOESTRIN FE 1 MG-10 MCG / 10 MCG tablet, Take 1 tablet by mouth daily., Disp: 3  Package, Rfl: 4 .  ondansetron (ZOFRAN) 4 MG tablet, Take 1 tablet (4 mg total) by mouth every 8 (eight) hours as needed for nausea or vomiting. (Patient not taking: Reported on 04/12/2018), Disp: 12 tablet, Rfl: 0 Allergies  Allergen Reactions  . Lactose Intolerance (Gi) Other (See Comments)    Upset stomach    Social History   Tobacco Use  . Smoking status: Current Some Day Smoker    Types: Cigarettes  . Smokeless tobacco: Never Used  Substance Use Topics  . Alcohol use: Yes    Alcohol/week: 1.2 oz    Types: 2 Shots of liquor per week    Comment: often     Family History  Problem Relation Age of Onset  . Cancer Maternal Grandmother        Breast  . Diabetes Maternal Grandmother       Review of Systems  Constitutional: negative for fatigue and weight loss Respiratory: negative for cough and wheezing Cardiovascular: negative for chest pain, fatigue and palpitations Gastrointestinal: negative for abdominal pain and change in bowel habits Musculoskeletal:negative for myalgias Neurological: negative for gait problems and tremors Behavioral/Psych: negative for abusive relationship, depression Endocrine: negative for temperature intolerance    Genitourinary:Positive for recurrent yeast infections Integument/breast: negative for breast lump, breast tenderness, nipple discharge and skin lesion(s)    Objective:       BP 115/70  Pulse 87   Ht 5\' 2"  (1.575 m)   Wt 162 lb 12.8 oz (73.8 kg)   LMP  (Approximate) Comment: 03/20/2017 ???   BMI 29.78 kg/m  General:   alert  Skin:   no rash or abnormalities  Lungs:   clear to auscultation bilaterally  Heart:   regular rate and rhythm, S1, S2 normal, no murmur, click, rub or gallop  Breasts:   normal without suspicious masses, skin or nipple changes or axillary nodes  Abdomen:  normal findings: no organomegaly, soft, non-tender and no hernia  Pelvis:  External genitalia: normal general appearance Urinary system: urethral  meatus normal and bladder without fullness, nontender Vaginal: normal without tenderness, induration or masses Cervix: normal appearance Adnexa: normal bimanual exam Uterus: anteverted and non-tender, normal size   Lab Review Urine pregnancy test Labs reviewed yes Radiologic studies reviewed no  50% of 20 min visit spent on counseling and coordination of care.   Assessment:     1. Encounter for gynecological examination  2. Screening for cervical cancer Rx: - Cytology - PAP  3. Vaginal discharge Rx: - Cervicovaginal ancillary only  4. Candida vaginitis Rx: - fluconazole (DIFLUCAN) 150 MG tablet; Take 1 tablet (150 mg total) by mouth once for 1 dose.  Dispense: 1 tablet; Refill: 5  5. Screening for STD (sexually transmitted disease) Rx: - Cervicovaginal ancillary only  6. Encounter for other general counseling and advice on contraception - wants Nexplanon removed and start OCP's  7. Encounter for initial prescription of contraceptive pills Rx: - LO LOESTRIN FE 1 MG-10 MCG / 10 MCG tablet; Take 1 tablet by mouth daily.  Dispense: 3 Package; Refill: 4  8. Routine adult health maintenance.  Has recurrent yeast infections. Rx: - Hemoglobin A1c    Plan:    Education reviewed: calcium supplements, depression evaluation, low fat, low cholesterol diet, safe sex/STD prevention, self breast exams, smoking cessation and weight bearing exercise. Contraception: OCP (estrogen/progesterone). Follow up in: 1 year.  Will return in 2 weeks for Nexplanon Removal.  Meds ordered this encounter  Medications  . LO LOESTRIN FE 1 MG-10 MCG / 10 MCG tablet    Sig: Take 1 tablet by mouth daily.    Dispense:  3 Package    Refill:  4    Submit other coverage code 3  BIN:  [id number]  PCN:  [id number]   GRP:  [id number]   ID:  [id number]  . fluconazole (DIFLUCAN) 150 MG tablet    Sig: Take 1 tablet (150 mg total) by mouth once for 1 dose.    Dispense:  1 tablet    Refill:  5   Orders Placed This  Encounter  Procedures  . Hemoglobin A1c    Brock Bad MD 04-12-2018   NGYN patient presents to Establish care.  LMP: aprx a month ago  Contraception: Nexplanon 3 yrs ago June 19 at [facility]  Last pap: 2018 @ [facility]  STD Screening:Declines   CC: Recurrent yeast infections.   Pt wants to do birth control pills.

## 2018-04-13 LAB — CERVICOVAGINAL ANCILLARY ONLY
Bacterial vaginitis: POSITIVE — AB
CHLAMYDIA, DNA PROBE: NEGATIVE
Candida vaginitis: POSITIVE — AB
Neisseria Gonorrhea: NEGATIVE
Trichomonas: NEGATIVE

## 2018-04-13 LAB — CYTOLOGY - PAP: DIAGNOSIS: NEGATIVE

## 2018-04-13 LAB — HEMOGLOBIN A1C
Est. average glucose Bld gHb Est-mCnc: 103 mg/dL
HEMOGLOBIN A1C: 5.2 % (ref 4.8–5.6)

## 2018-04-26 ENCOUNTER — Ambulatory Visit (INDEPENDENT_AMBULATORY_CARE_PROVIDER_SITE_OTHER): Payer: Managed Care, Other (non HMO) | Admitting: Obstetrics

## 2018-04-26 ENCOUNTER — Encounter: Payer: Self-pay | Admitting: Obstetrics

## 2018-04-26 ENCOUNTER — Encounter: Payer: Self-pay | Admitting: *Deleted

## 2018-04-26 VITALS — BP 128/80 | HR 92 | Ht 62.0 in | Wt 162.0 lb

## 2018-04-26 DIAGNOSIS — Z3046 Encounter for surveillance of implantable subdermal contraceptive: Secondary | ICD-10-CM

## 2018-04-26 NOTE — Progress Notes (Signed)
NEXPLANON REMOVAL NOTE  Date of LMP:   unknown  Contraception used: *Nexplanon   Indications:  The patient desires contraception.  She understands risks, benefits, and alternatives to Implanon and would like to proceed.  Anesthesia:   Lidocaine 1% plain.  Procedure:  A time-out was performed confirming the procedure and the patient's allergy status.  Complications: None                      The rod was palpated and the area was sterilely prepped.  The area beneath the distal tip was anesthetized with 1% xylocaine and the skin incised                       Over the tip and the tip was exposed, grasped with forcep and removed intact.  A single suture of 4-0 Vicryl was used to close incision.  Steri strip                       And a bandage applied and the arm was wrapped with gauze bandage.  The patient tolerated well.  Instructions:  The patient was instructed to remove the dressing in 24 hours and that some bruising is to be expected.  She was advised to use over the counter analgesics as needed for any pain at the site.  She is to keep the area dry for 24 hours and to call if her hand or arm becomes cold, numb, or blue.  Return visit:  Return in 2 weeks    Brock BadHARLES A. Jahvier Aldea MD 04-26-2018

## 2018-05-10 ENCOUNTER — Encounter: Payer: Self-pay | Admitting: Obstetrics

## 2018-05-10 ENCOUNTER — Ambulatory Visit (INDEPENDENT_AMBULATORY_CARE_PROVIDER_SITE_OTHER): Payer: Managed Care, Other (non HMO) | Admitting: Obstetrics

## 2018-05-10 VITALS — BP 133/87 | HR 88 | Wt 165.8 lb

## 2018-05-10 DIAGNOSIS — Z30011 Encounter for initial prescription of contraceptive pills: Secondary | ICD-10-CM | POA: Diagnosis not present

## 2018-05-10 DIAGNOSIS — Z3009 Encounter for other general counseling and advice on contraception: Secondary | ICD-10-CM | POA: Diagnosis not present

## 2018-05-10 DIAGNOSIS — Z3046 Encounter for surveillance of implantable subdermal contraceptive: Secondary | ICD-10-CM

## 2018-05-10 MED ORDER — LEVONORGEST-ETH ESTRADIOL-IRON 0.1-20 MG-MCG(21) PO TABS: 1.0000 | ORAL_TABLET | Freq: Every day | ORAL | 11 refills | Status: DC

## 2018-05-10 NOTE — Progress Notes (Signed)
Pt presents for Nexplanon f/u requests to start BCP.

## 2018-05-10 NOTE — Progress Notes (Signed)
Subjective:    Kim Stanton is a 27 y.o. female who presents for contraception counseling. The patient has no complaints today. The patient is sexually active. Pertinent past medical history: current smoker.  The information documented in the HPI was reviewed and verified.  Menstrual History: OB History    Gravida  0   Para      Term      Preterm      AB      Living        SAB      TAB      Ectopic      Multiple      Live Births               No LMP recorded. Patient has had an implant.   There are no active problems to display for this patient.  Past Medical History:  Diagnosis Date  . Medical history non-contributory     Past Surgical History:  Procedure Laterality Date  . NO PAST SURGERIES       Current Outpatient Medications:  .  Etonogestrel (NEXPLANON ), Inject into the skin., Disp: , Rfl:  .  Levonorgest-Eth Estrad-Fe Bisg (BALCOLTRA) 0.1-20 MG-MCG(21) TABS, Take 1 tablet by mouth daily., Disp: 28 tablet, Rfl: 11 Allergies  Allergen Reactions  . Lactose Intolerance (Gi) Other (See Comments)    Upset stomach    Social History   Tobacco Use  . Smoking status: Current Some Day Smoker    Types: Cigarettes  . Smokeless tobacco: Never Used  Substance Use Topics  . Alcohol use: Yes    Alcohol/week: 2.0 standard drinks    Types: 2 Shots of liquor per week    Comment: often     Family History  Problem Relation Age of Onset  . Cancer Maternal Grandmother        Breast  . Diabetes Maternal Grandmother        Review of Systems Constitutional: negative for weight loss Genitourinary:negative for abnormal menstrual periods and vaginal discharge   Objective:   BP 133/87   Pulse 88   Wt 165 lb 12.8 oz (75.2 kg)   BMI 30.33 kg/m    General:   alert  Skin:   no rash or abnormalities.  Nexplanon removal site is clean, non tender.  Lungs:   clear to auscultation bilaterally  Heart:   regular rate and rhythm, S1, S2 normal, no murmur,  click, rub or gallop  Lab Review Urine pregnancy test Labs reviewed yes Radiologic studies reviewed no  50% of 15 min visit spent on counseling and coordination of care.    Assessment:    27 y.o., discontinuing Nexplanon, and starting OCP's, no contraindications.  Tobacco Dependence present and is a relative contraindication, and quitting encouraged.   Plan:   Balcoltra 28 Rx Tobacco cessation recommended, I.e. Chantix.  All questions answered. Follow up in 1 year.    Meds ordered this encounter  Medications  . Levonorgest-Eth Estrad-Fe Bisg (BALCOLTRA) 0.1-20 MG-MCG(21) TABS    Sig: Take 1 tablet by mouth daily.    Dispense:  28 tablet    Refill:  11   No orders of the defined types were placed in this encounter.   Brock BadHARLES A. Jarell Mcewen MD 05-10-2018

## 2018-05-15 ENCOUNTER — Telehealth: Payer: Self-pay

## 2018-05-15 DIAGNOSIS — R109 Unspecified abdominal pain: Secondary | ICD-10-CM

## 2018-05-15 MED ORDER — IBUPROFEN 800 MG PO TABS: 800.0000 mg | ORAL_TABLET | Freq: Three times a day (TID) | ORAL | 3 refills | Status: DC | PRN

## 2018-05-15 NOTE — Telephone Encounter (Signed)
Pt called stating she had to stay out of work yesterday due to symptoms from a yeast infection. Pt wanted to know if she could have a note for work excusing her, I advised pt I could not do this for her because she was not seen by us in the office. Pt also requested to have some more ibuprofen sent to her pharmacy, I advised pt I would send this for her. Pt verbalized understanding.

## 2018-05-24 ENCOUNTER — Encounter (HOSPITAL_COMMUNITY): Payer: Self-pay

## 2018-05-24 ENCOUNTER — Ambulatory Visit: Payer: Managed Care, Other (non HMO) | Admitting: Obstetrics

## 2018-05-24 ENCOUNTER — Emergency Department (HOSPITAL_COMMUNITY): Payer: Managed Care, Other (non HMO)

## 2018-05-24 ENCOUNTER — Emergency Department (HOSPITAL_COMMUNITY)
Admission: EM | Admit: 2018-05-24 | Discharge: 2018-05-24 | Disposition: A | Discharge status: Home / Self Care | Payer: Managed Care, Other (non HMO) | Attending: Emergency Medicine | Admitting: Emergency Medicine

## 2018-05-24 DIAGNOSIS — Z79899 Other long term (current) drug therapy: Secondary | ICD-10-CM | POA: Insufficient documentation

## 2018-05-24 DIAGNOSIS — Y92410 Unspecified street and highway as the place of occurrence of the external cause: Secondary | ICD-10-CM | POA: Insufficient documentation

## 2018-05-24 DIAGNOSIS — S199XXA Unspecified injury of neck, initial encounter: Secondary | ICD-10-CM | POA: Diagnosis present

## 2018-05-24 DIAGNOSIS — R1084 Generalized abdominal pain: Secondary | ICD-10-CM | POA: Diagnosis not present

## 2018-05-24 DIAGNOSIS — S161XXA Strain of muscle, fascia and tendon at neck level, initial encounter: Secondary | ICD-10-CM | POA: Diagnosis not present

## 2018-05-24 DIAGNOSIS — Y998 Other external cause status: Secondary | ICD-10-CM | POA: Diagnosis not present

## 2018-05-24 DIAGNOSIS — Y939 Activity, unspecified: Secondary | ICD-10-CM | POA: Insufficient documentation

## 2018-05-24 DIAGNOSIS — F17210 Nicotine dependence, cigarettes, uncomplicated: Secondary | ICD-10-CM | POA: Insufficient documentation

## 2018-05-24 DIAGNOSIS — M791 Myalgia, unspecified site: Secondary | ICD-10-CM | POA: Diagnosis not present

## 2018-05-24 DIAGNOSIS — M7918 Myalgia, other site: Secondary | ICD-10-CM

## 2018-05-24 LAB — CBC WITH DIFFERENTIAL/PLATELET
Abs Immature Granulocytes: 0 10*3/uL (ref 0.0–0.1)
BASOS ABS: 0 10*3/uL (ref 0.0–0.1)
BASOS PCT: 0 %
EOS PCT: 1 %
Eosinophils Absolute: 0 10*3/uL (ref 0.0–0.7)
HCT: 39.2 % (ref 36.0–46.0)
HEMOGLOBIN: 13.1 g/dL (ref 12.0–15.0)
Immature Granulocytes: 0 %
LYMPHS PCT: 25 %
Lymphs Abs: 1.5 10*3/uL (ref 0.7–4.0)
MCH: 31 pg (ref 26.0–34.0)
MCHC: 33.4 g/dL (ref 30.0–36.0)
MCV: 92.7 fL (ref 78.0–100.0)
Monocytes Absolute: 0.5 10*3/uL (ref 0.1–1.0)
Monocytes Relative: 9 %
Neutro Abs: 3.8 10*3/uL (ref 1.7–7.7)
Neutrophils Relative %: 65 %
PLATELETS: 329 10*3/uL (ref 150–400)
RBC: 4.23 MIL/uL (ref 3.87–5.11)
RDW: 12.6 % (ref 11.5–15.5)
WBC: 5.8 10*3/uL (ref 4.0–10.5)

## 2018-05-24 LAB — BASIC METABOLIC PANEL
ANION GAP: 10 (ref 5–15)
BUN: 10 mg/dL (ref 6–20)
CALCIUM: 9.2 mg/dL (ref 8.9–10.3)
CO2: 24 mmol/L (ref 22–32)
Chloride: 104 mmol/L (ref 98–111)
Creatinine, Ser: 0.82 mg/dL (ref 0.44–1.00)
GFR calc Af Amer: >60 mL/min (ref >60)
Glucose, Bld: 107 mg/dL — ABNORMAL HIGH (ref 70–99)
POTASSIUM: 3.4 mmol/L — AB (ref 3.5–5.1)
SODIUM: 138 mmol/L (ref 135–145)

## 2018-05-24 LAB — I-STAT BETA HCG BLOOD, ED (MC, WL, AP ONLY)

## 2018-05-24 MED ORDER — IBUPROFEN 400 MG PO TABS
600.0000 mg | ORAL_TABLET | Freq: Once | ORAL | Status: AC
  Administered 2018-05-24: 600 mg via ORAL
  Filled 2018-05-24: qty 1

## 2018-05-24 MED ORDER — IOPAMIDOL (ISOVUE-300) INJECTION 61%
INTRAVENOUS | Status: AC
  Filled 2018-05-24: qty 100

## 2018-05-24 MED ORDER — CYCLOBENZAPRINE HCL 10 MG PO TABS
5.0000 mg | ORAL_TABLET | Freq: Once | ORAL | Status: AC
  Administered 2018-05-24: 5 mg via ORAL
  Filled 2018-05-24: qty 1

## 2018-05-24 MED ORDER — IBUPROFEN 600 MG PO TABS: 600.0000 mg | ORAL_TABLET | Freq: Four times a day (QID) | ORAL | 0 refills | Status: DC | PRN

## 2018-05-24 MED ORDER — IOPAMIDOL (ISOVUE-300) INJECTION 61%
100.0000 mL | Freq: Once | INTRAVENOUS | Status: AC
  Administered 2018-05-24: 100 mL via INTRAVENOUS

## 2018-05-24 MED ORDER — CYCLOBENZAPRINE HCL 10 MG PO TABS: 10.0000 mg | ORAL_TABLET | Freq: Two times a day (BID) | ORAL | 0 refills | Status: DC | PRN

## 2018-05-24 NOTE — Discharge Instructions (Addendum)
Motrin and Flexeril as needed as prescribed.  Follow-up with PCP, referral given if needed.  Return to ER for worsening or concerning symptoms.  Apply warm compresses to sore achy muscles for 20 minutes at a time.
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
## 2018-05-24 NOTE — ED Notes (Signed)
ED Provider at bedside. 
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
## 2018-05-30 ENCOUNTER — Ambulatory Visit: Payer: Managed Care, Other (non HMO) | Attending: Family Medicine | Admitting: Family Medicine

## 2018-05-30 ENCOUNTER — Encounter: Payer: Self-pay | Admitting: Family Medicine

## 2018-05-30 VITALS — BP 104/71 | HR 86 | Temp 98.2°F | Resp 18 | Ht 62.0 in | Wt 169.4 lb

## 2018-05-30 DIAGNOSIS — Z87891 Personal history of nicotine dependence: Secondary | ICD-10-CM | POA: Insufficient documentation

## 2018-05-30 DIAGNOSIS — M545 Low back pain, unspecified: Secondary | ICD-10-CM

## 2018-05-30 DIAGNOSIS — R350 Frequency of micturition: Secondary | ICD-10-CM | POA: Insufficient documentation

## 2018-05-30 DIAGNOSIS — E739 Lactose intolerance, unspecified: Secondary | ICD-10-CM | POA: Insufficient documentation

## 2018-05-30 DIAGNOSIS — M542 Cervicalgia: Secondary | ICD-10-CM | POA: Diagnosis not present

## 2018-05-30 DIAGNOSIS — M25512 Pain in left shoulder: Secondary | ICD-10-CM | POA: Insufficient documentation

## 2018-05-30 DIAGNOSIS — M62838 Other muscle spasm: Secondary | ICD-10-CM

## 2018-05-30 DIAGNOSIS — Z803 Family history of malignant neoplasm of breast: Secondary | ICD-10-CM | POA: Insufficient documentation

## 2018-05-30 DIAGNOSIS — R1032 Left lower quadrant pain: Secondary | ICD-10-CM | POA: Diagnosis not present

## 2018-05-30 DIAGNOSIS — M62830 Muscle spasm of back: Secondary | ICD-10-CM

## 2018-05-30 DIAGNOSIS — R1030 Lower abdominal pain, unspecified: Secondary | ICD-10-CM | POA: Diagnosis not present

## 2018-05-30 DIAGNOSIS — M25511 Pain in right shoulder: Secondary | ICD-10-CM | POA: Diagnosis not present

## 2018-05-30 LAB — POCT URINALYSIS DIP (CLINITEK)
Bilirubin, UA: NEGATIVE
Blood, UA: NEGATIVE
Glucose, UA: NEGATIVE mg/dL
Ketones, POC UA: NEGATIVE mg/dL
Leukocytes, UA: NEGATIVE
Nitrite, UA: NEGATIVE
Spec Grav, UA: >=1.03 — AB
Urobilinogen, UA: 0.2 U/dL
pH, UA: 6

## 2018-05-30 MED ORDER — TRAMADOL HCL 50 MG PO TABS: 50.0000 mg | ORAL_TABLET | Freq: Three times a day (TID) | ORAL | 0 refills | Status: DC | PRN

## 2018-05-30 MED ORDER — CYCLOBENZAPRINE HCL 10 MG PO TABS: 10.0000 mg | ORAL_TABLET | Freq: Every day | ORAL | 0 refills | Status: AC

## 2018-05-30 MED ORDER — TRAMADOL HCL 50 MG PO TABS: 50.0000 mg | ORAL_TABLET | Freq: Three times a day (TID) | ORAL | 0 refills | Status: AC | PRN

## 2018-05-30 MED ORDER — PREDNISONE 20 MG PO TABS: ORAL_TABLET | ORAL | 0 refills | Status: DC
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
## 2018-06-06 ENCOUNTER — Encounter (INDEPENDENT_AMBULATORY_CARE_PROVIDER_SITE_OTHER): Payer: Self-pay | Admitting: Orthopaedic Surgery

## 2018-06-06 ENCOUNTER — Encounter: Payer: Self-pay | Admitting: Obstetrics

## 2018-06-06 ENCOUNTER — Ambulatory Visit (INDEPENDENT_AMBULATORY_CARE_PROVIDER_SITE_OTHER): Payer: Managed Care, Other (non HMO) | Admitting: Obstetrics

## 2018-06-06 ENCOUNTER — Encounter: Payer: Self-pay | Admitting: *Deleted

## 2018-06-06 ENCOUNTER — Ambulatory Visit (INDEPENDENT_AMBULATORY_CARE_PROVIDER_SITE_OTHER): Payer: Managed Care, Other (non HMO) | Admitting: Orthopaedic Surgery

## 2018-06-06 VITALS — BP 124/90 | HR 93 | Ht 62.0 in | Wt 169.5 lb

## 2018-06-06 DIAGNOSIS — N898 Other specified noninflammatory disorders of vagina: Secondary | ICD-10-CM | POA: Diagnosis not present

## 2018-06-06 DIAGNOSIS — Z3202 Encounter for pregnancy test, result negative: Secondary | ICD-10-CM | POA: Diagnosis not present

## 2018-06-06 DIAGNOSIS — B373 Candidiasis of vulva and vagina: Secondary | ICD-10-CM | POA: Diagnosis not present

## 2018-06-06 DIAGNOSIS — M25512 Pain in left shoulder: Secondary | ICD-10-CM

## 2018-06-06 DIAGNOSIS — M542 Cervicalgia: Secondary | ICD-10-CM

## 2018-06-06 DIAGNOSIS — Z30016 Encounter for initial prescription of transdermal patch hormonal contraceptive device: Secondary | ICD-10-CM | POA: Diagnosis not present

## 2018-06-06 DIAGNOSIS — Z3009 Encounter for other general counseling and advice on contraception: Secondary | ICD-10-CM

## 2018-06-06 DIAGNOSIS — G8929 Other chronic pain: Secondary | ICD-10-CM | POA: Diagnosis not present

## 2018-06-06 DIAGNOSIS — Z113 Encounter for screening for infections with a predominantly sexual mode of transmission: Secondary | ICD-10-CM | POA: Diagnosis not present

## 2018-06-06 DIAGNOSIS — B3731 Acute candidiasis of vulva and vagina: Secondary | ICD-10-CM

## 2018-06-06 MED ORDER — NORELGESTROMIN-ETH ESTRADIOL 150-35 MCG/24HR TD PTWK: 1.0000 | MEDICATED_PATCH | TRANSDERMAL | 12 refills | Status: DC

## 2018-06-06 MED ORDER — FLUCONAZOLE 200 MG PO TABS: 200.0000 mg | ORAL_TABLET | ORAL | 2 refills | Status: DC
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
## 2018-06-06 NOTE — Progress Notes (Signed)
Presents for Contraceptive management, she wants to change her OCP, it is making her bloated, Nauseous, having cramps and migraines.  She stopped taking the pills 2 weeks ago and she has being having sex without any protection. Had unprotected sex 3 days ago.   UPT today is NEGATIVE  C/o Ph Balance been off, she wears tight fitting alethics pants to work 5 days/week.

## 2018-06-07 ENCOUNTER — Encounter: Payer: Self-pay | Admitting: Obstetrics

## 2018-06-07 LAB — POCT URINE PREGNANCY: PREG TEST UR: NEGATIVE

## 2018-06-07 NOTE — Progress Notes (Signed)
Patient ID: UMEKA RAUBER, female   DOB: Jun 21, 1991, 27 y.o.   MRN: [id number]  Chief Complaint  Patient presents with  . Contraception    HPI Kim Stanton is a 27 y.o. female.  Wants to discuss changing birth control.  Stopped taking OCP's because of migraines and bloating.  Also thinks that she has BV. HPI  Past Medical History:  Diagnosis Date  . Medical history non-contributory     Past Surgical History:  Procedure Laterality Date  . NO PAST SURGERIES      Family History  Problem Relation Age of Onset  . Cancer Maternal Grandmother        Breast  . Diabetes Maternal Grandmother     Social History Social History   Tobacco Use  . Smoking status: Current Some Day Smoker    Types: Cigarettes  . Smokeless tobacco: Never Used  Substance Use Topics  . Alcohol use: Yes    Alcohol/week: 2.0 standard drinks    Types: 2 Shots of liquor per week    Comment: often WEEKENDS (05/30/18)  . Drug use: Not Currently    Allergies  Allergen Reactions  . Lactose Intolerance (Gi) Other (See Comments)    Upset stomach    Current Outpatient Medications  Medication Sig Dispense Refill  . ibuprofen (ADVIL,MOTRIN) 600 MG tablet Take 1 tablet (600 mg total) by mouth every 6 (six) hours as needed. 30 tablet 0  . cyclobenzaprine (FLEXERIL) 10 MG tablet Take 1 tablet (10 mg total) by mouth at bedtime for 30 doses. As needed for muscle spasm 30 tablet 0  . Etonogestrel (NEXPLANON [place]) Inject into the skin.    . fluconazole (DIFLUCAN) 200 MG tablet Take 1 tablet (200 mg total) by mouth every 3 (three) days. 3 tablet 2  . Levonorgest-Eth Estrad-Fe Bisg (BALCOLTRA) 0.1-20 MG-MCG(21) TABS Take 1 tablet by mouth daily. (Patient not taking: Reported on 05/30/2018) 28 tablet 11  . norelgestromin-ethinyl estradiol [employer]ico) 150-35 MCG/24HR transdermal patch Place 1 patch onto the skin once a week. 3 patch 12  . predniSONE (DELTASONE) 20 MG tablet Take 2 pills (40mg ) on the first day then 1 pill (20mg )  x 2 days, then 1/2 pill x 4 days; take after eating (Patient not taking: Reported on 06/06/2018) 6 tablet 0  . traMADol (ULTRAM) 50 MG tablet Take 1 tablet (50 mg total) by mouth every 8 (eight) hours as needed for up to 10 days for moderate pain. 30 tablet 0   No current facility-administered medications for this visit.     Review of Systems Review of Systems Constitutional: negative for fatigue and weight loss Respiratory: negative for cough and wheezing Cardiovascular: negative for chest pain, fatigue and palpitations Gastrointestinal: negative for abdominal pain and change in bowel habits Genitourinary:negative Integument/breast: negative for nipple discharge Musculoskeletal:negative for myalgias Neurological: negative for gait problems and tremors Behavioral/Psych: negative for abusive relationship, depression Endocrine: negative for temperature intolerance      Blood pressure 124/90, pulse 93, height 5\' 2"  (1.575 m), weight 169 lb 8 oz (76.9 kg), last menstrual period 05/21/2018.  Physical Exam Physical Exam:            General:  Alert and no distress Abdomen:  normal findings: no organomegaly, soft, non-tender and no hernia  Pelvis:  External genitalia: normal general appearance Urinary system: urethral meatus normal and bladder without fullness, nontender Vaginal: normal without tenderness, induration or masses.  Thick white cottage cheese-like discharge Cervix: normal appearance Adnexa: normal bimanual exam Uterus:  anteverted and non-tender, normal size    >50% of 15 min visit spent on counseling and coordination of care.   Data Reviewed Wet Prep and Cultures  Assessment     1. Encounter for other general counseling and advice on contraception - wants a different contraceptive method - transdermal patch recommended, but cautioned that she should stop if HA's occur  2. Encounter for initial prescription of transdermal patch hormonal contraceptive device Rx: -  norelgestromin-ethinyl estradiol [employer] Medico) 150-35 MCG/24HR transdermal patch; Place 1 patch onto the skin once a week.  Dispense: 3 patch; Refill: 12  3. Vaginal discharge Rx: - Cervicovaginal ancillary only  4. Candida vaginitis Rx: - POCT urine pregnancy - fluconazole (DIFLUCAN) 200 MG tablet; Take 1 tablet (200 mg total) by mouth every 3 (three) days.  Dispense: 3 tablet; Refill: 2    Plan    Follow up prn  Orders Placed This Encounter  Procedures  . POCT urine pregnancy   Meds ordered this encounter  Medications  . fluconazole (DIFLUCAN) 200 MG tablet    Sig: Take 1 tablet (200 mg total) by mouth every 3 (three) days.    Dispense:  3 tablet    Refill:  2  . norelgestromin-ethinyl estradiol [employer] Medico) 150-35 MCG/24HR transdermal patch    Sig: Place 1 patch onto the skin once a week.    Dispense:  3 patch    Refill:  12     Brock Bad MD [phone]

## 2018-06-08 ENCOUNTER — Inpatient Hospital Stay: Payer: Managed Care, Other (non HMO)

## 2018-06-08 LAB — CERVICOVAGINAL ANCILLARY ONLY
BACTERIAL VAGINITIS: NEGATIVE
CANDIDA VAGINITIS: POSITIVE — AB
CHLAMYDIA, DNA PROBE: NEGATIVE
Neisseria Gonorrhea: NEGATIVE
Trichomonas: NEGATIVE

## 2018-06-09 ENCOUNTER — Other Ambulatory Visit: Payer: Self-pay | Admitting: Obstetrics

## 2018-06-23 ENCOUNTER — Telehealth (INDEPENDENT_AMBULATORY_CARE_PROVIDER_SITE_OTHER): Payer: Self-pay | Admitting: Orthopaedic Surgery

## 2018-06-23 NOTE — Telephone Encounter (Signed)
Patient requesting rx refill on flexeril, she would like this called into [facility] on [facility]. Patients # [phone]

## 2018-06-23 NOTE — Telephone Encounter (Signed)
Please advise 

## 2018-06-25 ENCOUNTER — Other Ambulatory Visit (INDEPENDENT_AMBULATORY_CARE_PROVIDER_SITE_OTHER): Payer: Self-pay | Admitting: Orthopaedic Surgery

## 2018-06-25 MED ORDER — CYCLOBENZAPRINE HCL 5 MG PO TABS: 5.0000 mg | ORAL_TABLET | Freq: Three times a day (TID) | ORAL | 3 refills | Status: DC | PRN

## 2018-06-25 NOTE — Telephone Encounter (Signed)
done

## 2018-09-11 ENCOUNTER — Other Ambulatory Visit: Payer: Self-pay | Admitting: Obstetrics

## 2018-09-11 DIAGNOSIS — B3731 Acute candidiasis of vulva and vagina: Secondary | ICD-10-CM

## 2018-09-11 DIAGNOSIS — B373 Candidiasis of vulva and vagina: Secondary | ICD-10-CM

## 2018-09-11 MED ORDER — FLUCONAZOLE 200 MG PO TABS: 200.0000 mg | ORAL_TABLET | ORAL | 2 refills | Status: DC

## 2018-09-14 ENCOUNTER — Telehealth (INDEPENDENT_AMBULATORY_CARE_PROVIDER_SITE_OTHER): Payer: Self-pay | Admitting: Orthopaedic Surgery

## 2018-09-14 NOTE — Telephone Encounter (Signed)
refaxed billing to Automated Records Collection

## 2018-09-27 NOTE — L&D Delivery Note (Addendum)
OB/[facility]N Faculty Practice Delivery Note  Kim Stanton is a 28 y.o. G1P1 at [redacted]w[redacted]d  admitted for active labor, rupture of membranes  ROM: 20h 69m with clear fluid GBS Status:  --Henderson Cloud (10/06 0508)  Labor Progress: . Patient presented to L&D for LOR and SROM. Initial SVE: 2//70/-2. She then progressed to complete.   Delivery Date/Time: 07/17/19 at 1206 Delivery: Called to room and patient was complete and pushing. Head delivered LOA with no nuchal cord present. Delivery complicated by shoulder dystocia lasting 3:50 minutes.    12:03 head delivered  12:04 Mcroberts and suprapubic pressure, Dr. Nehemiah Settle and NICU called to room for assistance 12:05 Woods screw and patient moved from side to side. Eventually placed in [place] and attempted to deliver the posterior shoulder. I was just able to begin to move the posterior shoulder as Dr. Nehemiah Settle arrived. He stepped in at this time.  12:06 infant was delivered and handed to NICU team.   Help was called for a shoulder Dystocia. McRoberts, suprapubic pressure and anterior shoulder disimpaction were all immediately attempted but unsuccessful in delivering the shoulders. Patient was placed in hand and needs and [place]crew maneuver was then attempted and the posterior shoulder was delivered. Cord urgently clamped for resuscitation with PPV. Cord blood drawn. Placenta delivered spontaneously with gentle cord traction. Fundus firm with massage and Pitocin. Labia, perineum, vagina, and cervix inspected inspected with 2nd degree laceration that was repaired with 2-0 vicryl by attending.    Baby Weight: 3910, 9lbs 9.9oz  Placenta: Sent to L&D Complications: shoulder dystocia  Lacerations: 2nd degree laceration EBL: 331 Analgesia: Epidural   Infant: APGAR (1 MIN): 3   APGAR (5 MINS): 7   APGAR (10 MINS): [place], MD, PGY1  [facility] [place] [facility] 07/17/2019, 1:28 PM   I was present with the resident  for the entire delivery and stepped in at about 12:03:30 to assist with SD maneuvers. I agree with the note and assisted in writing it.   Marcille Buffy DNP, CNM  07/17/19  3:01 PM

## 2018-11-09 ENCOUNTER — Ambulatory Visit (INDEPENDENT_AMBULATORY_CARE_PROVIDER_SITE_OTHER): Payer: Managed Care, Other (non HMO)

## 2018-11-09 ENCOUNTER — Encounter: Payer: Self-pay | Admitting: Certified Nurse Midwife

## 2018-11-09 ENCOUNTER — Ambulatory Visit: Payer: Managed Care, Other (non HMO) | Admitting: Obstetrics

## 2018-11-09 VITALS — BP 124/84 | HR 82 | Resp 16 | Ht 62.0 in | Wt 174.5 lb

## 2018-11-09 DIAGNOSIS — Z3200 Encounter for pregnancy test, result unknown: Secondary | ICD-10-CM

## 2018-11-09 DIAGNOSIS — Z3201 Encounter for pregnancy test, result positive: Secondary | ICD-10-CM | POA: Diagnosis not present

## 2018-11-09 LAB — POCT URINE PREGNANCY: Preg Test, Ur: POSITIVE — AB

## 2018-11-09 NOTE — Progress Notes (Signed)
Ms. Detemple presents today for UPT. She complains of abdominal pain in the lower midline. Patient reports the abdominal pain is more like cramps and gas that will not go away. Patient advised to discontinue taking Ibuprofen ( Advil) and take OTC Acetaminophen (Tylenol) as directed.  LMP: 10/10/2018 EDD: 07/17/2019    OBJECTIVE: Appears well, in no apparent distress.  OB History    Gravida  1   Para      Term      Preterm      AB      Living        SAB      TAB      Ectopic      Multiple      Live Births             Home UPT Result: Yesterday: Positive In-Office UPT result: Positive  I have reviewed the patient's medical, obstetrical, social, and family histories, and medications.   ASSESSMENT: Positive pregnancy test  PLAN:  Prenatal care to be completed at: [facility] Advised to begin Prenatal vitamins. Advised to schedule Initial OB visit between 10 - 12 weeks. Advised if begin experiencing acute abdominal pain or bleeding, shortness of breath or chest pain, go to [facility] for evaluation and treatment.

## 2018-11-10 NOTE — Progress Notes (Signed)
I have reviewed the chart and agree with nursing staff's documentation of this patient's encounter.  Mickenzie Stolar, MD 11/10/2018 8:36 AM    

## 2018-11-22 ENCOUNTER — Telehealth: Payer: Self-pay

## 2018-11-22 NOTE — Telephone Encounter (Signed)
  Patient left message on Triage line stating that she has questions about her pregnancy,  Call was returned to patient she wanted to know what to take for a cold. Safe OTC meds list given per OB Protocol

## 2018-11-25 ENCOUNTER — Inpatient Hospital Stay (HOSPITAL_COMMUNITY)
Admission: AD | Admit: 2018-11-25 | Discharge: 2018-11-25 | Disposition: A | Discharge status: Home / Self Care | Payer: Managed Care, Other (non HMO) | Source: Ambulatory Visit | Attending: Obstetrics and Gynecology | Admitting: Obstetrics and Gynecology

## 2018-11-25 ENCOUNTER — Encounter (HOSPITAL_COMMUNITY): Payer: Self-pay

## 2018-11-25 ENCOUNTER — Inpatient Hospital Stay (HOSPITAL_COMMUNITY): Payer: Managed Care, Other (non HMO)

## 2018-11-25 DIAGNOSIS — O23591 Infection of other part of genital tract in pregnancy, first trimester: Secondary | ICD-10-CM | POA: Diagnosis not present

## 2018-11-25 DIAGNOSIS — Z87891 Personal history of nicotine dependence: Secondary | ICD-10-CM | POA: Insufficient documentation

## 2018-11-25 DIAGNOSIS — Z3A08 8 weeks gestation of pregnancy: Secondary | ICD-10-CM | POA: Diagnosis not present

## 2018-11-25 DIAGNOSIS — O209 Hemorrhage in early pregnancy, unspecified: Secondary | ICD-10-CM | POA: Diagnosis not present

## 2018-11-25 DIAGNOSIS — B9689 Other specified bacterial agents as the cause of diseases classified elsewhere: Secondary | ICD-10-CM | POA: Diagnosis not present

## 2018-11-25 DIAGNOSIS — Z3A01 Less than 8 weeks gestation of pregnancy: Secondary | ICD-10-CM | POA: Insufficient documentation

## 2018-11-25 LAB — URINALYSIS, ROUTINE W REFLEX MICROSCOPIC
BILIRUBIN URINE: NEGATIVE
Glucose, UA: NEGATIVE mg/dL
KETONES UR: 5 mg/dL — AB
LEUKOCYTE UA: NEGATIVE
NITRITE: NEGATIVE
PROTEIN: NEGATIVE mg/dL
SPECIFIC GRAVITY, URINE: 1.03 (ref 1.005–1.030)
pH: 5 (ref 5.0–8.0)

## 2018-11-25 LAB — WET PREP, GENITAL
Sperm: NONE SEEN
TRICH WET PREP: NONE SEEN
YEAST WET PREP: NONE SEEN

## 2018-11-25 LAB — CBC
HEMATOCRIT: 37.3 % (ref 36.0–46.0)
HEMOGLOBIN: 12.4 g/dL (ref 12.0–15.0)
MCH: 29.2 pg (ref 26.0–34.0)
MCHC: 33.2 g/dL (ref 30.0–36.0)
MCV: 87.8 fL (ref 80.0–100.0)
Platelets: 363 10*3/uL (ref 150–400)
RBC: 4.25 MIL/uL (ref 3.87–5.11)
RDW: 12.5 % (ref 11.5–15.5)
WBC: 6.7 10*3/uL (ref 4.0–10.5)
nRBC: 0 % (ref 0.0–0.2)

## 2018-11-25 LAB — ABO/RH: ABO/RH(D): AB POS

## 2018-11-25 LAB — HCG, QUANTITATIVE, PREGNANCY: HCG, BETA CHAIN, QUANT, S: 44948 m[IU]/mL — AB (ref <5)

## 2018-11-25 MED ORDER — METRONIDAZOLE 500 MG PO TABS: 500.0000 mg | ORAL_TABLET | Freq: Two times a day (BID) | ORAL | 0 refills | Status: DC

## 2018-11-25 NOTE — Discharge Instructions (Signed)
Vaginal Bleeding During Pregnancy, First Trimester  A small amount of bleeding from the vagina (spotting) is relatively common during early pregnancy. It usually stops on its own. Various things may cause bleeding or spotting during early pregnancy. Some bleeding may be related to the pregnancy, and some may not. In many cases, the bleeding is normal and is not a problem. However, bleeding can also be a sign of something serious. Be sure to tell your health care provider about any vaginal bleeding right away. Some possible causes of vaginal bleeding during the first trimester include:  Infection or inflammation of the cervix.  Growths (polyps) on the cervix.  Miscarriage or threatened miscarriage.  Pregnancy tissue developing outside of the uterus (ectopic pregnancy).  A mass of tissue developing in the uterus due to an egg being fertilized incorrectly (molar pregnancy). Follow these instructions at home: Activity  Follow instructions from your health care provider about limiting your activity. Ask what activities are safe for you.  If needed, make plans for someone to help with your regular activities.  Do not have sex or orgasms until your health care provider says that this is safe. General instructions  Take over-the-counter and prescription medicines only as told by your health care provider.  Pay attention to any changes in your symptoms.  Do not use tampons or douche.  Write down how many pads you use each day, how often you change pads, and how soaked (saturated) they are.  If you pass any tissue from your vagina, save the tissue so you can show it to your health care provider.  Keep all follow-up visits as told by your health care provider. This is important. Contact a health care provider if:  You have vaginal bleeding during any part of your pregnancy.  You have cramps or labor pains.  You have a fever. Get help right away if:  You have severe cramps in your  back or abdomen.  You pass large clots or a large amount of tissue from your vagina.  Your bleeding increases.  You feel light-headed or weak, or you faint.  You have chills.  You are leaking fluid or have a gush of fluid from your vagina. Summary  A small amount of bleeding (spotting) from the vagina is relatively common during early pregnancy.  Various things may cause bleeding or spotting in early pregnancy.  Be sure to tell your health care provider about any vaginal bleeding right away. This information is not intended to replace advice given to you by your health care provider. Make sure you discuss any questions you have with your health care provider. Document Released: 06/23/2005 Document Revised: 12/16/2016 Document Reviewed: 12/16/2016 Elsevier Interactive Patient Education  2019 Elsevier Inc.  Bacterial Vaginosis  Bacterial vaginosis is a vaginal infection that occurs when the normal balance of bacteria in the vagina is disrupted. It results from an overgrowth of certain bacteria. This is the most common vaginal infection among women ages 64-44. Because bacterial vaginosis increases your risk for STIs (sexually transmitted infections), getting treated can help reduce your risk for chlamydia, gonorrhea, herpes, and HIV (human immunodeficiency virus). Treatment is also important for preventing complications in pregnant women, because this condition can cause an early (premature) delivery. What are the causes? This condition is caused by an increase in harmful bacteria that are normally present in small amounts in the vagina. However, the reason that the condition develops is not fully understood. What increases the risk? The following factors may make you  more likely to develop this condition:  Having a new sexual partner or multiple sexual partners.  Having unprotected sex.  Douching.  Having an intrauterine device (IUD).  Smoking.  Drug and alcohol  abuse.  Taking certain antibiotic medicines.  Being pregnant. You cannot get bacterial vaginosis from toilet seats, bedding, swimming pools, or contact with objects around you. What are the signs or symptoms? Symptoms of this condition include:  Grey or white vaginal discharge. The discharge can also be watery or foamy.  A fish-like odor with discharge, especially after sexual intercourse or during menstruation.  Itching in and around the vagina.  Burning or pain with urination. Some women with bacterial vaginosis have no signs or symptoms. How is this diagnosed? This condition is diagnosed based on:  Your medical history.  A physical exam of the vagina.  Testing a sample of vaginal fluid under a microscope to look for a large amount of bad bacteria or abnormal cells. Your health care provider may use a cotton swab or a small wooden spatula to collect the sample. How is this treated? This condition is treated with antibiotics. These may be given as a pill, a vaginal cream, or a medicine that is put into the vagina (suppository). If the condition comes back after treatment, a second round of antibiotics may be needed. Follow these instructions at home: Medicines  Take over-the-counter and prescription medicines only as told by your health care provider.  Take or use your antibiotic as told by your health care provider. Do not stop taking or using the antibiotic even if you start to feel better. General instructions  If you have a female sexual partner, tell her that you have a vaginal infection. She should see her health care provider and be treated if she has symptoms. If you have a female sexual partner, he does not need treatment.  During treatment: ? Avoid sexual activity until you finish treatment. ? Do not douche. ? Avoid alcohol as directed by your health care provider. ? Avoid breastfeeding as directed by your health care provider.  Drink enough water and fluids to  keep your urine clear or pale yellow.  Keep the area around your vagina and rectum clean. ? Wash the area daily with warm water. ? Wipe yourself from front to back after using the toilet.  Keep all follow-up visits as told by your health care provider. This is important. How is this prevented?  Do not douche.  Wash the outside of your vagina with warm water only.  Use protection when having sex. This includes latex condoms and dental dams.  Limit how many sexual partners you have. To help prevent bacterial vaginosis, it is best to have sex with just one partner (monogamous).  Make sure you and your sexual partner are tested for STIs.  Wear cotton or cotton-lined underwear.  Avoid wearing tight pants and pantyhose, especially during summer.  Limit the amount of alcohol that you drink.  Do not use any products that contain nicotine or tobacco, such as cigarettes and e-cigarettes. If you need help quitting, ask your health care provider.  Do not use illegal drugs. Where to find more information  Centers for Disease Control and Prevention: SolutionApps.co.za  American Sexual Health Association (ASHA): www.ashastd.org  U.S. Department of Health and Health and safety inspector, Office on Women's Health: ConventionalMedicines.si or http://www.anderson-williamson.info/ Contact a health care provider if:  Your symptoms do not improve, even after treatment.  You have more discharge or pain when urinating.  You  have a fever.  You have pain in your abdomen.  You have pain during sex.  You have vaginal bleeding between periods. Summary  Bacterial vaginosis is a vaginal infection that occurs when the normal balance of bacteria in the vagina is disrupted.  Because bacterial vaginosis increases your risk for STIs (sexually transmitted infections), getting treated can help reduce your risk for chlamydia, gonorrhea, herpes, and HIV (human immunodeficiency virus). Treatment is  also important for preventing complications in pregnant women, because the condition can cause an early (premature) delivery.  This condition is treated with antibiotic medicines. These may be given as a pill, a vaginal cream, or a medicine that is put into the vagina (suppository). This information is not intended to replace advice given to you by your health care provider. Make sure you discuss any questions you have with your health care provider. Document Released: 09/13/2005 Document Revised: 01/17/2017 Document Reviewed: 05/29/2016 Elsevier Interactive Patient Education  2019 [employer].

## 2018-11-25 NOTE — MAU Provider Note (Addendum)
History     CSN: [id number]  Arrival date and time: 11/25/18 1222   First Provider Initiated Contact with Patient 11/25/18 1313      Chief Complaint  Patient presents with  . Vaginal Bleeding  . Abdominal Pain   Kim Stanton is a 28 y.o. G1P0 at [redacted]w[redacted]d presenting for vaginal bleeding. She noticed light pink when wiping yesterday, nothing overnight, then darker red with two small clots upon waking. She has continued to have dark red spotting today. She denies pelvic pain or cramping, has some left side gas pains from constipation x2wks. She's been taking colace every other day. She denies nausea, vomiting, or diarrhea. Last SI over a week ago. She had a self-diagnosed yeast infection for which she started Monistat two days ago  Past Medical History:  Diagnosis Date  . Medical history non-contributory    Past Surgical History:  Procedure Laterality Date  . NO PAST SURGERIES     Family History  Problem Relation Age of Onset  . Cancer Maternal Grandmother        Breast  . Diabetes Maternal Grandmother    Social History   Tobacco Use  . Smoking status: Former Smoker    Types: Cigarettes    Last attempt to quit: 11/09/2018    Years since quitting: 0.0  . Smokeless tobacco: Never Used  Substance Use Topics  . Alcohol use: Yes    Alcohol/week: 2.0 standard drinks    Types: 2 Shots of liquor per week    Comment: often WEEKENDS (05/30/18)  . Drug use: Not Currently   Allergies:  Allergies  Allergen Reactions  . Lactose Intolerance (Gi) Other (See Comments)    Upset stomach   Medications Prior to Admission  Medication Sig Dispense Refill Last Dose  . cyclobenzaprine (FLEXERIL) 5 MG tablet Take 1-2 tablets (5-10 mg total) by mouth 3 (three) times daily as needed for muscle spasms. 30 tablet 3 Taking  . ibuprofen (ADVIL,MOTRIN) 600 MG tablet Take 1 tablet (600 mg total) by mouth every 6 (six) hours as needed. (Patient not taking: Reported on 11/09/2018) 30 tablet 0 Not Taking   . norelgestromin-ethinyl estradiol [employer]) 150-35 MCG/24HR transdermal patch Place 1 patch onto the skin once a week. (Patient not taking: Reported on 11/09/2018) 3 patch 12 Not Taking   Review of Systems  Constitutional: Negative.  Negative for fatigue and fever.  HENT: Negative.  Negative for congestion (getting over a cold).   Eyes: Negative.   Respiratory: Negative.   Cardiovascular: Negative.   Gastrointestinal: Positive for abdominal pain (mild left lower quadrant) and constipation.  Endocrine: Negative.   Genitourinary: Negative.  Negative for pelvic pain.  Musculoskeletal: Negative.   Skin: Negative.   Allergic/Immunologic: Negative.   Neurological: Negative.   Hematological: Negative.   Psychiatric/Behavioral: Negative.    Physical Exam   Blood pressure 117/72, pulse 84, temperature 98.5 F (36.9 C), temperature source Oral, resp. rate 16, height  (1.575 m), weight 79.4 kg, last menstrual period 10/10/2018, SpO2 100 %.  Physical Exam  Nursing note and vitals reviewed. Constitutional: She is oriented to person, place, and time. She appears well-developed and well-nourished. No distress.  HENT:  Head: Normocephalic.  Eyes: Pupils are equal, round, and reactive to light.  Neck: Normal range of motion.  Cardiovascular: Normal rate, regular rhythm and normal heart sounds.  No murmur heard. Respiratory: Effort normal and breath sounds normal. No respiratory distress.  GI: Soft. Bowel sounds are normal. She exhibits no distension and  no mass. There is no abdominal tenderness. There is no rebound and no guarding.  Genitourinary:    Uterus normal.     Vaginal discharge (dark red bleeding visualized on SE) present.   Musculoskeletal: Normal range of motion.  Neurological: She is alert and oriented to person, place, and time.  Skin: Skin is warm and dry. She is not diaphoretic.  Psychiatric: She has a normal mood and affect. Her behavior is normal. Judgment and thought  content normal.   US Ob Less Than 14 Weeks With Ob Transvaginal  Result Date: 11/25/2018 CLINICAL DATA:  Vaginal bleeding in 1st trimester pregnancy. Gestational age by LMP of 6 weeks 4 days. EXAM: OBSTETRIC <14 WK [place] AND TRANSVAGINAL OB US TECHNIQUE: Both transabdominal and transvaginal ultrasound examinations were performed for complete evaluation of the gestation as well as the maternal uterus, adnexal regions, and pelvic cul-de-sac. Transvaginal technique was performed to assess early pregnancy. COMPARISON:  None. FINDINGS: Intrauterine gestational sac: Single Yolk sac:  Visualized. Embryo:  Visualized. Cardiac Activity: Visualized. Heart Rate: 118 bpm CRL:  4 mm   6 w   1 d                  [place] EDC: 07/20/2019 Subchorionic hemorrhage:  None visualized. Maternal uterus/adnexae: Both ovaries are normal in appearance. Small right ovarian corpus luteum cyst is noted. No mass or abnormal free fluid identified. IMPRESSION: Single living IUP measuring 6 weeks 1 day, with [place] EDC of 07/20/2019. No significant maternal uterine or adnexal abnormality identified. Electronically Signed   By: Myles Rosenthal M.D.   On: 11/25/2018 15:23   Results for orders placed or performed during the hospital encounter of 11/25/18 (from the past 24 hour(s))  Urinalysis, Routine w reflex microscopic     Status: Abnormal   Collection Time: 11/25/18 12:42 PM  Result Value Ref Range   Color, Urine AMBER (A) YELLOW   APPearance HAZY (A) CLEAR   Specific Gravity, Urine 1.030 1.005 - 1.030   pH 5.0 5.0 - 8.0   Glucose, UA NEGATIVE NEGATIVE mg/dL   Hgb urine dipstick LARGE (A) NEGATIVE   Bilirubin Urine NEGATIVE NEGATIVE   Ketones, ur 5 (A) NEGATIVE mg/dL   Protein, ur NEGATIVE NEGATIVE mg/dL   Nitrite NEGATIVE NEGATIVE   Leukocytes,Ua NEGATIVE NEGATIVE   RBC / HPF 0-5 0 - 5 RBC/hpf   WBC, UA 0-5 0 - 5 WBC/hpf   Bacteria, UA RARE (A) NONE SEEN   Squamous Epithelial / LPF 11-20 0 - 5   Mucus PRESENT   Wet prep, genital      Status: Abnormal   Collection Time: 11/25/18  1:24 PM  Result Value Ref Range   Yeast Wet Prep HPF POC NONE SEEN NONE SEEN   Trich, Wet Prep NONE SEEN NONE SEEN   Clue Cells Wet Prep HPF POC PRESENT (A) NONE SEEN   WBC, Wet Prep HPF POC MODERATE (A) NONE SEEN   Sperm NONE SEEN   CBC     Status: None   Collection Time: 11/25/18  2:01 PM  Result Value Ref Range   WBC 6.7 4.0 - 10.5 K/uL   RBC 4.25 3.87 - 5.11 MIL/uL   Hemoglobin 12.4 12.0 - 15.0 g/dL   HCT 31.5 17.6 - 16.0 %   MCV 87.8 80.0 - 100.0 fL   MCH 29.2 26.0 - 34.0 pg   MCHC 33.2 30.0 - 36.0 g/dL   RDW 73.7 10.6 - 26.9 %   Platelets 363 150 -  400 K/uL   nRBC 0.0 0.0 - 0.2 %  hCG, quantitative, pregnancy     Status: Abnormal   Collection Time: 11/25/18  2:01 PM  Result Value Ref Range   hCG, Beta Chain, Quant, S 44,948 (H) <5 mIU/mL  ABO/Rh     Status: None   Collection Time: 11/25/18  2:01 PM  Result Value Ref Range   ABO/RH(D)      AB POS Performed at [facility], [address], [place], [place] [address]    MAU Course  Procedures  MDM Wet prep (+BV), GC/CT CBC, ABO/Rh (AB+), Beta Hcg Pelvic ultrasound  Assessment and Plan  Bacterial vaginosis in pregnancy - oral Flagyl, discharge to home Follow-up at [facility] at [facility] Return if vaginal bleeding/cramping   Bernerd Limbo, SNM 11/25/2018, 2:30 PM

## 2018-11-25 NOTE — MAU Note (Signed)
Pt noticed some spotting a day or two ago, got a little worse with some small clots. Started having pain on her left side a few minutes ago.

## 2018-11-26 ENCOUNTER — Encounter: Payer: Self-pay | Admitting: Family Medicine

## 2018-11-27 LAB — GC/CHLAMYDIA PROBE AMP (~~LOC~~) NOT AT ARMC
Chlamydia: NEGATIVE
Neisseria Gonorrhea: NEGATIVE

## 2018-12-15 ENCOUNTER — Telehealth: Payer: Managed Care, Other (non HMO) | Admitting: Nurse Practitioner

## 2018-12-15 DIAGNOSIS — R112 Nausea with vomiting, unspecified: Secondary | ICD-10-CM

## 2018-12-15 NOTE — Progress Notes (Signed)
Based on what you shared with me it looks like you have nausea and vomiting,that should be evaluated in a face to face office visit. Need to either see or speak wit OB becaue to early on pregnancy to treat safely with zofran   NOTE: If you entered your credit card information for this eVisit, you will not be charged. You may see a "hold" on your card for the $30 but that hold will drop off and you will not have a charge processed.  If you are having a true medical emergency please call 911.  If you need an urgent face to face visit, [place] has four urgent care centers for your convenience.  If you need care fast and have a high deductible or no insurance consider:   [email or web address] to reserve your spot online an avoid wait times  [facility] [address], [address] [place], [place] [address] 8 am to 8 pm Monday-Friday 10 am to 4 pm Saturday-Sunday *Across the street from [employer]  [address] [place], [address] 8 am to 5 pm Monday-Friday * In the [facility] on the [facility]   The following sites will take your  insurance:  . [facility]  [phone] Get Driving Directions Find a Provider at this Location  [address] [place], [place] [address] . 10 am to 8 pm Monday-Friday . 12 pm to 8 pm Saturday-Sunday   . [facility]  [phone] Get Driving Directions Find a Provider at this Location  [address] [place], [place] [address] . 8 am to 8 pm Monday-Friday . 9 am to 6 pm Saturday . 11 am to 6 pm Sunday   . [facility] at [facility]  [phone] Get Driving Directions  [address] [place], [place] [address] . 8 am to 8 pm Monday-Friday . 8 am to 4 pm Saturday-Sunday   Your e-visit answers were reviewed by a board certified advanced clinical practitioner to complete your personal  care plan.

## 2018-12-21 ENCOUNTER — Ambulatory Visit (INDEPENDENT_AMBULATORY_CARE_PROVIDER_SITE_OTHER): Payer: Managed Care, Other (non HMO) | Admitting: Certified Nurse Midwife

## 2018-12-21 ENCOUNTER — Other Ambulatory Visit: Payer: Self-pay

## 2018-12-21 ENCOUNTER — Encounter: Payer: Self-pay | Admitting: Certified Nurse Midwife

## 2018-12-21 VITALS — BP 106/72 | HR 89 | Wt 174.1 lb

## 2018-12-21 DIAGNOSIS — Z3400 Encounter for supervision of normal first pregnancy, unspecified trimester: Secondary | ICD-10-CM | POA: Insufficient documentation

## 2018-12-21 DIAGNOSIS — N898 Other specified noninflammatory disorders of vagina: Secondary | ICD-10-CM

## 2018-12-21 DIAGNOSIS — B373 Candidiasis of vulva and vagina: Secondary | ICD-10-CM

## 2018-12-21 DIAGNOSIS — Z3A10 10 weeks gestation of pregnancy: Secondary | ICD-10-CM

## 2018-12-21 DIAGNOSIS — Z3490 Encounter for supervision of normal pregnancy, unspecified, unspecified trimester: Secondary | ICD-10-CM

## 2018-12-21 DIAGNOSIS — Z3401 Encounter for supervision of normal first pregnancy, first trimester: Secondary | ICD-10-CM | POA: Diagnosis not present

## 2018-12-21 DIAGNOSIS — Z124 Encounter for screening for malignant neoplasm of cervix: Secondary | ICD-10-CM | POA: Diagnosis not present

## 2018-12-21 DIAGNOSIS — B3731 Acute candidiasis of vulva and vagina: Secondary | ICD-10-CM

## 2018-12-21 DIAGNOSIS — Z113 Encounter for screening for infections with a predominantly sexual mode of transmission: Secondary | ICD-10-CM | POA: Diagnosis not present

## 2018-12-21 HISTORY — DX: Encounter for supervision of normal pregnancy, unspecified, unspecified trimester: Z34.90

## 2018-12-21 NOTE — Patient Instructions (Signed)

## 2018-12-21 NOTE — Progress Notes (Signed)
History:   Kim Stanton is a 28 y.o. G1P0 at [redacted]w[redacted]d by LMP being seen today for her first obstetrical visit.  Her obstetrical history is significant for obesity. Patient does intend to breast feed. Pregnancy history fully reviewed.  Patient reports vaginal irritation. Patient and boyfriend expressed frustration with visitor policy prior to appointment and over the phone during appointment.   HISTORY: OB History  Gravida Para Term Preterm AB Living  1 0 0 0 0 0  SAB TAB Ectopic Multiple Live Births  0 0 0 0 0    # Outcome Date GA Lbr Len/2nd Weight Sex Delivery Anes PTL Lv  1 Current             Last pap smear was done 04/12/2018 and was normal  Past Medical History:  Diagnosis Date  . Medical history non-contributory    Past Surgical History:  Procedure Laterality Date  . NO PAST SURGERIES    . WISDOM TOOTH EXTRACTION     Family History  Problem Relation Age of Onset  . Cancer Maternal Grandmother        Breast  . Diabetes Maternal Grandmother    Social History   Tobacco Use  . Smoking status: Former Smoker    Types: Cigarettes    Last attempt to quit: 11/09/2018    Years since quitting: 0.1  . Smokeless tobacco: Never Used  Substance Use Topics  . Alcohol use: Not Currently    Alcohol/week: 2.0 standard drinks    Types: 2 Shots of liquor per week    Comment: often WEEKENDS (05/30/18)  . Drug use: Never   Allergies  Allergen Reactions  . Lactose Intolerance (Gi) Other (See Comments)    Upset stomach   Current Outpatient Medications on File Prior to Visit  Medication Sig Dispense Refill  . cyclobenzaprine (FLEXERIL) 5 MG tablet Take 1-2 tablets (5-10 mg total) by mouth 3 (three) times daily as needed for muscle spasms. 30 tablet 3  . Prenatal Vit-Fe Fumarate-FA (PRENATAL VITAMINS PLUS PO) Take by mouth.     No current facility-administered medications on file prior to visit.     Review of Systems Pertinent items noted in HPI and remainder of comprehensive  ROS otherwise negative. Physical Exam:   Vitals:   12/21/18 1032  BP: 106/72  Pulse: 89  Weight: 174 lb 1.6 oz (79 kg)     Pelvic Exam: Perineum: no hemorrhoids, normal perineum   Vulva: normal external genitalia, no lesions   Vagina:  normal mucosa, moderate amount of white discharge w/o odor   Cervix: no lesions and normal, pap smear done.    Adnexa: normal adnexa and no mass, fullness, tenderness   Bony Pelvis: average  System: General: well-developed, well-nourished female in no acute distress   Skin: normal coloration and turgor, no rashes   Neurologic: oriented, normal, negative, normal mood   Extremities: normal strength, tone, and muscle mass, ROM of all joints is normal   HEENT PERRLA, extraocular movement intact and sclera clear   Mouth/Teeth mucous membranes moist, pharynx normal without lesions and dental hygiene good   Neck supple and no masses   Cardiovascular: regular rate and rhythm   Respiratory:  no respiratory distress, normal breath sounds   Abdomen: soft, non-tender; bowel sounds normal; no masses,  no organomegaly  Bedside Ultrasound for FHR check: Patient informed that the ultrasound is considered a limited obstetric ultrasound and is not intended to be a complete ultrasound exam.  Patient also informed  that the ultrasound is not being completed with the intent of assessing for fetal or placental anomalies or any pelvic abnormalities.  Explained that the purpose of today's ultrasound is to assess for fetal heart rate.  Patient acknowledges the purpose of the exam and the limitations of the study.      FHR 150 by bedside US  Assessment:    Pregnancy: G1P0 Patient Active Problem List   Diagnosis Date Noted  . Supervision of normal first pregnancy, antepartum 12/21/2018     Plan:    1. Supervision of normal first pregnancy, antepartum - Educated and discussed recommendations and restrictions on visitors in detail due to COVID19, expressed understanding  frustrations but it is nothing that can be changed at this time.  - Discussed babyscripts with patient, patient agrees to optimized scheduling with babyscripts and agrees to take BP and weight at home weekly  - Unable to hear FHR with doppler, patient concerned about starting scheduling without hearing FHR, will bring patient back in 2 weeks for doppler check and then start scheduling using optimized scheduled  - Culture, OB Urine - Obstetric Panel, Including HIV - Cervicovaginal ancillary only( [place]) - Genetic Screening - Cytology - PAP( [place]) - HgB A1c - Enroll patient in Babyscripts Program - Babyscripts Schedule Optimization   Initial labs drawn. Continue prenatal vitamins. Genetic Screening discussed, NIPS: ordered. Ultrasound discussed; fetal anatomic survey: requested. Problem list reviewed and updated. The nature of [facility] with multiple MDs and other Advanced Practice Providers was explained to patient; also emphasized that residents, students are part of our team. Routine obstetric precautions reviewed. Return in about 2 weeks (around 01/04/2019) for ROB/doppler check .     Sharyon Cable, CNM [facility] [employer], [facility]

## 2018-12-22 LAB — OBSTETRIC PANEL, INCLUDING HIV
Antibody Screen: NEGATIVE
Basophils Absolute: 0 10*3/uL (ref 0.0–0.2)
Basos: 0 %
EOS (ABSOLUTE): 0.1 10*3/uL (ref 0.0–0.4)
Eos: 1 %
HIV Screen 4th Generation wRfx: NONREACTIVE
Hematocrit: 33.6 % — ABNORMAL LOW (ref 34.0–46.6)
Hemoglobin: 11.9 g/dL (ref 11.1–15.9)
Hepatitis B Surface Ag: NEGATIVE
Immature Grans (Abs): 0 10*3/uL (ref 0.0–0.1)
Immature Granulocytes: 0 %
Lymphocytes Absolute: 2.7 10*3/uL (ref 0.7–3.1)
Lymphs: 36 %
MCH: 30.4 pg (ref 26.6–33.0)
MCHC: 35.4 g/dL (ref 31.5–35.7)
MCV: 86 fL (ref 79–97)
Monocytes Absolute: 0.6 10*3/uL (ref 0.1–0.9)
Monocytes: 8 %
Neutrophils Absolute: 4 10*3/uL (ref 1.4–7.0)
Neutrophils: 55 %
Platelets: 336 10*3/uL (ref 150–450)
RBC: 3.91 x10E6/uL (ref 3.77–5.28)
RDW: 13.1 % (ref 11.7–15.4)
RPR Ser Ql: NONREACTIVE
Rh Factor: POSITIVE
Rubella Antibodies, IGG: 2.59 index (ref >0.99)
WBC: 7.5 10*3/uL (ref 3.4–10.8)

## 2018-12-22 LAB — CERVICOVAGINAL ANCILLARY ONLY
Bacterial vaginitis: NEGATIVE
Candida vaginitis: POSITIVE — AB

## 2018-12-22 LAB — CYTOLOGY - PAP
Chlamydia: NEGATIVE
Diagnosis: NEGATIVE
Neisseria Gonorrhea: NEGATIVE
Trichomonas: NEGATIVE

## 2018-12-22 LAB — HEMOGLOBIN A1C
Est. average glucose Bld gHb Est-mCnc: 103 mg/dL
Hgb A1c MFr Bld: 5.2 % (ref 4.8–5.6)

## 2018-12-24 LAB — CULTURE, OB URINE

## 2018-12-24 LAB — URINE CULTURE, OB REFLEX

## 2018-12-25 MED ORDER — TERCONAZOLE 0.8 % VA CREA: 1.0000 | TOPICAL_CREAM | Freq: Every day | VAGINAL | 0 refills | Status: DC

## 2018-12-25 NOTE — Addendum Note (Signed)
Addended by: Sharyon Cable on: 12/25/2018 10:10 AM   Modules accepted: Orders

## 2018-12-27 ENCOUNTER — Encounter: Payer: Self-pay | Admitting: Obstetrics

## 2019-01-02 ENCOUNTER — Encounter: Payer: Self-pay | Admitting: *Deleted

## 2019-01-03 ENCOUNTER — Encounter: Payer: Self-pay | Admitting: Certified Nurse Midwife

## 2019-01-03 ENCOUNTER — Other Ambulatory Visit: Payer: Self-pay

## 2019-01-03 DIAGNOSIS — Z3483 Encounter for supervision of other normal pregnancy, third trimester: Secondary | ICD-10-CM

## 2019-01-04 ENCOUNTER — Ambulatory Visit (INDEPENDENT_AMBULATORY_CARE_PROVIDER_SITE_OTHER): Payer: Managed Care, Other (non HMO) | Admitting: Certified Nurse Midwife

## 2019-01-04 ENCOUNTER — Encounter: Payer: Self-pay | Admitting: Certified Nurse Midwife

## 2019-01-04 DIAGNOSIS — Z3A12 12 weeks gestation of pregnancy: Secondary | ICD-10-CM

## 2019-01-04 DIAGNOSIS — Z3402 Encounter for supervision of normal first pregnancy, second trimester: Secondary | ICD-10-CM

## 2019-01-04 DIAGNOSIS — Z3400 Encounter for supervision of normal first pregnancy, unspecified trimester: Secondary | ICD-10-CM

## 2019-01-04 MED ORDER — BLOOD PRESSURE MONITOR KIT: 1.0000 | PACK | 0 refills | Status: DC

## 2019-01-04 NOTE — Progress Notes (Addendum)
   TELEHEALTH VIRTUAL OBSTETRICS VISIT ENCOUNTER NOTE  I connected with Kim Stanton on 01/04/19 at  9:43 AM EDT by telephone at home and verified that I am speaking with the correct person using two identifiers.   I discussed the limitations, risks, security and privacy concerns of performing an evaluation and management service by telephone and the availability of in person appointments. I also discussed with the patient that there may be a patient responsible charge related to this service. The patient expressed understanding and agreed to proceed.  Subjective:  Kim Stanton is a 28 y.o. G1P0 at [redacted]w[redacted]d being followed for ongoing prenatal care.  She is currently monitored for the following issues for this low-risk pregnancy and has Supervision of normal first pregnancy, antepartum on their problem list.  Patient reports backache and headache. Denies any contractions, bleeding or leaking of fluid.   The following portions of the patient's history were reviewed and updated as appropriate: allergies, current medications, past family history, past medical history, past social history, past surgical history and problem list.   Objective:   General:  Alert, oriented and cooperative.   Mental Status: Normal mood and affect perceived. Normal judgment and thought content.  Rest of physical exam deferred due to type of encounter  Assessment and Plan:  Pregnancy: G1P0 at [redacted]w[redacted]d 1. Supervision of normal first pregnancy, antepartum - Routine prenatal care  - Anticipatory guidance on upcoming appointments  - Works at [employer] center, needs accomodation letter starting no lifting more than 10-15lbs, frequent breaks every 2 hours, able to sit down at least every 2 hours for at least 15-20 minutes.   - On FMLA paperwork allow intermittent leave days (sick days 5-7 days)  - Educated and discussed medication that is safe to take during pregnancy  - BP cuff Rx sent to pharmacy on file  -  Babyscripts Schedule Optimization - [place] MFM OB COMP + 14 WK; Future  Preterm labor symptoms and general obstetric precautions including but not limited to vaginal bleeding, contractions, leaking of fluid and fetal movement were reviewed in detail with the patient.  I discussed the assessment and treatment plan with the patient. The patient was provided an opportunity to ask questions and all were answered. The patient agreed with the plan and demonstrated an understanding of the instructions. The patient was advised to call back or seek an in-person office evaluation/go to MAU at [facility] for any urgent or concerning symptoms. Please refer to After Visit Summary for other counseling recommendations.   I provided 17 minutes of non-face-to-face time during this encounter.  Return in about 6 weeks (around 02/15/2019) for ROB- Telehealth.  Sharyon Cable, CNM [facility] [employer], [facility]

## 2019-01-04 NOTE — Addendum Note (Signed)
Addended by: Sharyon Cable on: 01/04/2019 12:33 PM   Modules accepted: Orders

## 2019-01-04 NOTE — Progress Notes (Signed)
  Per pt not on baby Scripts Cuff placed up front

## 2019-01-12 ENCOUNTER — Telehealth: Payer: Self-pay

## 2019-01-12 NOTE — Telephone Encounter (Signed)
I called patient to inform her that her message has been sent to the provider to get approval for her request. Pt states that she has some paperwork to be dropped off that needs to filled out for this request. Pt informed that it takes 7-10 days for paperwork to be completed pending response from provider. Pt verbalized understanding.

## 2019-01-18 ENCOUNTER — Encounter: Payer: Self-pay | Admitting: Certified Nurse Midwife

## 2019-01-18 DIAGNOSIS — Z3483 Encounter for supervision of other normal pregnancy, third trimester: Secondary | ICD-10-CM

## 2019-02-07 ENCOUNTER — Telehealth: Payer: Self-pay | Admitting: *Deleted

## 2019-02-07 NOTE — Telephone Encounter (Signed)
Pt called to office to ask if she could take sleep aids OTC.  Pt states she feels like she is having insomnia and only sleeping a few hours. Pt made aware of OTC meds that are safe. Pt advised to make office aware if OTC meds do not help with sleep.   Pt states understanding.

## 2019-02-15 ENCOUNTER — Other Ambulatory Visit: Payer: Self-pay

## 2019-02-15 ENCOUNTER — Ambulatory Visit (INDEPENDENT_AMBULATORY_CARE_PROVIDER_SITE_OTHER): Payer: Managed Care, Other (non HMO)

## 2019-02-15 VITALS — BP 118/75 | HR 89

## 2019-02-15 DIAGNOSIS — Z3400 Encounter for supervision of normal first pregnancy, unspecified trimester: Secondary | ICD-10-CM

## 2019-02-15 DIAGNOSIS — Z3A18 18 weeks gestation of pregnancy: Secondary | ICD-10-CM

## 2019-02-15 NOTE — Progress Notes (Signed)
   TELEHEALTH VIRTUAL OBSTETRICS VISIT ENCOUNTER NOTE  I connected with Kim Stanton on 02/15/19 at  9:15 AM EDT by WebEx at home and verified that I am speaking with the correct person using two identifiers.   I discussed the limitations, risks, security and privacy concerns of performing an evaluation and management service by telephone and the availability of in person appointments. I also discussed with the patient that there may be a patient responsible charge related to this service. The patient expressed understanding and agreed to proceed.  Subjective:  Kim Stanton is a 28 y.o. G1P0 at [redacted]w[redacted]d being followed for ongoing prenatal care.  She is currently monitored for the following issues for this low-risk pregnancy and has Supervision of normal first pregnancy, antepartum on their problem list.  Patient reports tingling in hands and skin break outs. Reports fetal movement. Denies any contractions, bleeding or leaking of fluid.   The following portions of the patient's history were reviewed and updated as appropriate: allergies, current medications, past family history, past medical history, past social history, past surgical history and problem list.   Objective:   General:  Alert, oriented and cooperative.   Mental Status: Normal mood and affect perceived. Normal judgment and thought content.  Rest of physical exam deferred due to type of encounter  Assessment and Plan:  Pregnancy: G1P0 at [redacted]w[redacted]d 1. Supervision of normal first pregnancy, antepartum -BP 118/75 -Discussed normalcy of carpal tunnel syndrome in pregnancy and support measures -Reassured of normalcy of break outs related to hormone changes -List of safe medications for sleep reviewed with patient -Patient with questions about exercise in pregnancy. Encouraged patient to be active, drink lots of water and to listen to body to not over do it -Anatomy u/s scheduled 5/26 -Anticipatory guidance of next visit in person for  GTT and labs reviewed with patient - Encouraged patient to call ahead if experiencing respiratory symptoms with fever so that she can be triaged and directed appropriately if testing is warranted  Preterm labor symptoms and general obstetric precautions including but not limited to vaginal bleeding, contractions, leaking of fluid and fetal movement were reviewed in detail with the patient.  I discussed the assessment and treatment plan with the patient. The patient was provided an opportunity to ask questions and all were answered. The patient agreed with the plan and demonstrated an understanding of the instructions. The patient was advised to call back or seek an in-person office evaluation/go to MAU at [facility] for any urgent or concerning symptoms. Please refer to After Visit Summary for other counseling recommendations.   I provided 25 minutes of non-face-to-face time during this encounter.  Return in about 10 weeks (around 04/26/2019) for Return OB visit, 2hr GTT and labs.  Future Appointments  Date Time Provider Department Center  02/15/2019  9:15 AM Rolm Bookbinder, CNM [facility] None  02/20/2019 10:15 AM [facility] [place] 4 [facility]    Rolm Bookbinder, CNM [facility] [employer], [facility]

## 2019-02-15 NOTE — Progress Notes (Signed)
Pt is on the phone preparing for virtual visit with provider. [redacted]w[redacted]d. Anatomy scan [place] scheduled 02/20/2019.

## 2019-02-20 ENCOUNTER — Other Ambulatory Visit (HOSPITAL_COMMUNITY): Payer: Self-pay | Admitting: *Deleted

## 2019-02-20 ENCOUNTER — Other Ambulatory Visit: Payer: Self-pay

## 2019-02-20 ENCOUNTER — Ambulatory Visit (HOSPITAL_COMMUNITY)
Admission: RE | Admit: 2019-02-20 | Discharge: 2019-02-20 | Disposition: A | Discharge status: Home / Self Care | Payer: Managed Care, Other (non HMO) | Source: Ambulatory Visit | Attending: Obstetrics and Gynecology | Admitting: Obstetrics and Gynecology

## 2019-02-20 ENCOUNTER — Other Ambulatory Visit: Payer: Self-pay | Admitting: Certified Nurse Midwife

## 2019-02-20 DIAGNOSIS — Z3400 Encounter for supervision of normal first pregnancy, unspecified trimester: Secondary | ICD-10-CM | POA: Insufficient documentation

## 2019-02-20 DIAGNOSIS — Z3A19 19 weeks gestation of pregnancy: Secondary | ICD-10-CM

## 2019-02-20 DIAGNOSIS — Z363 Encounter for antenatal screening for malformations: Secondary | ICD-10-CM | POA: Diagnosis not present

## 2019-02-20 DIAGNOSIS — O99212 Obesity complicating pregnancy, second trimester: Secondary | ICD-10-CM | POA: Diagnosis not present

## 2019-02-20 DIAGNOSIS — O359XX0 Maternal care for (suspected) fetal abnormality and damage, unspecified, not applicable or unspecified: Secondary | ICD-10-CM

## 2019-02-20 DIAGNOSIS — Z362 Encounter for other antenatal screening follow-up: Secondary | ICD-10-CM

## 2019-02-21 ENCOUNTER — Encounter: Payer: Self-pay | Admitting: Certified Nurse Midwife

## 2019-02-21 DIAGNOSIS — O358XX0 Maternal care for other (suspected) fetal abnormality and damage, not applicable or unspecified: Secondary | ICD-10-CM | POA: Insufficient documentation

## 2019-02-21 DIAGNOSIS — O35EXX0 Maternal care for other (suspected) fetal abnormality and damage, fetal genitourinary anomalies, not applicable or unspecified: Secondary | ICD-10-CM | POA: Insufficient documentation

## 2019-03-06 DIAGNOSIS — Z3483 Encounter for supervision of other normal pregnancy, third trimester: Secondary | ICD-10-CM

## 2019-03-12 ENCOUNTER — Telehealth: Payer: Self-pay | Admitting: *Deleted

## 2019-03-12 NOTE — Telephone Encounter (Signed)
Pt called to office and left message regarding FMLA paperwork.  Attempt to return call.   No answer, LM on VM to call office.

## 2019-03-20 ENCOUNTER — Ambulatory Visit (HOSPITAL_COMMUNITY)
Admission: RE | Admit: 2019-03-20 | Discharge: 2019-03-20 | Disposition: A | Discharge status: Home / Self Care | Payer: Managed Care, Other (non HMO) | Source: Ambulatory Visit | Attending: Obstetrics and Gynecology | Admitting: Obstetrics and Gynecology

## 2019-03-20 ENCOUNTER — Other Ambulatory Visit: Payer: Self-pay

## 2019-03-20 DIAGNOSIS — Z3A23 23 weeks gestation of pregnancy: Secondary | ICD-10-CM | POA: Diagnosis not present

## 2019-03-20 DIAGNOSIS — O359XX0 Maternal care for (suspected) fetal abnormality and damage, unspecified, not applicable or unspecified: Secondary | ICD-10-CM

## 2019-03-20 DIAGNOSIS — Z362 Encounter for other antenatal screening follow-up: Secondary | ICD-10-CM | POA: Diagnosis present

## 2019-03-20 DIAGNOSIS — O99212 Obesity complicating pregnancy, second trimester: Secondary | ICD-10-CM

## 2019-03-21 ENCOUNTER — Other Ambulatory Visit (HOSPITAL_COMMUNITY): Payer: Self-pay | Admitting: *Deleted

## 2019-03-21 DIAGNOSIS — O358XX0 Maternal care for other (suspected) fetal abnormality and damage, not applicable or unspecified: Secondary | ICD-10-CM

## 2019-03-21 DIAGNOSIS — O35EXX0 Maternal care for other (suspected) fetal abnormality and damage, fetal genitourinary anomalies, not applicable or unspecified: Secondary | ICD-10-CM

## 2019-04-11 ENCOUNTER — Other Ambulatory Visit: Payer: Self-pay | Admitting: Internal Medicine

## 2019-04-11 DIAGNOSIS — Z20822 Contact with and (suspected) exposure to covid-19: Secondary | ICD-10-CM

## 2019-04-14 LAB — NOVEL CORONAVIRUS, NAA: SARS-CoV-2, NAA: NOT DETECTED

## 2019-04-26 ENCOUNTER — Encounter: Payer: Managed Care, Other (non HMO) | Admitting: Certified Nurse Midwife

## 2019-04-26 ENCOUNTER — Other Ambulatory Visit: Payer: Managed Care, Other (non HMO)

## 2019-04-28 ENCOUNTER — Other Ambulatory Visit: Payer: Self-pay

## 2019-04-28 ENCOUNTER — Other Ambulatory Visit: Payer: Self-pay | Admitting: Certified Nurse Midwife

## 2019-04-28 DIAGNOSIS — B3731 Acute candidiasis of vulva and vagina: Secondary | ICD-10-CM

## 2019-04-28 DIAGNOSIS — B373 Candidiasis of vulva and vagina: Secondary | ICD-10-CM

## 2019-04-30 ENCOUNTER — Other Ambulatory Visit: Payer: Self-pay

## 2019-04-30 DIAGNOSIS — B3731 Acute candidiasis of vulva and vagina: Secondary | ICD-10-CM

## 2019-04-30 DIAGNOSIS — B373 Candidiasis of vulva and vagina: Secondary | ICD-10-CM

## 2019-04-30 MED ORDER — TERCONAZOLE 0.8 % VA CREA: 1.0000 | TOPICAL_CREAM | Freq: Every day | VAGINAL | 0 refills | Status: DC

## 2019-04-30 NOTE — Progress Notes (Signed)
error 

## 2019-04-30 NOTE — Progress Notes (Signed)
terazol rx reordered

## 2019-05-01 ENCOUNTER — Other Ambulatory Visit: Payer: Managed Care, Other (non HMO)

## 2019-05-01 ENCOUNTER — Ambulatory Visit (INDEPENDENT_AMBULATORY_CARE_PROVIDER_SITE_OTHER): Payer: Managed Care, Other (non HMO) | Admitting: Obstetrics and Gynecology

## 2019-05-01 ENCOUNTER — Other Ambulatory Visit: Payer: Self-pay

## 2019-05-01 VITALS — BP 105/70 | HR 93 | Temp 97.8°F | Wt 188.8 lb

## 2019-05-01 DIAGNOSIS — Z3A29 29 weeks gestation of pregnancy: Secondary | ICD-10-CM

## 2019-05-01 DIAGNOSIS — O35EXX0 Maternal care for other (suspected) fetal abnormality and damage, fetal genitourinary anomalies, not applicable or unspecified: Secondary | ICD-10-CM

## 2019-05-01 DIAGNOSIS — Z148 Genetic carrier of other disease: Secondary | ICD-10-CM | POA: Insufficient documentation

## 2019-05-01 DIAGNOSIS — O358XX0 Maternal care for other (suspected) fetal abnormality and damage, not applicable or unspecified: Secondary | ICD-10-CM

## 2019-05-01 DIAGNOSIS — Z3400 Encounter for supervision of normal first pregnancy, unspecified trimester: Secondary | ICD-10-CM

## 2019-05-01 HISTORY — DX: Genetic carrier of other disease: Z14.8

## 2019-05-01 NOTE — Patient Instructions (Signed)
Third Trimester of Pregnancy The third trimester is from week 28 through week 40 (months 7 through 9). The third trimester is a time when the unborn baby (fetus) is growing rapidly. At the end of the ninth month, the fetus is about 20 inches in length and weighs 6-10 pounds. Body changes during your third trimester Your body will continue to go through many changes during pregnancy. The changes vary from woman to woman. During the third trimester:  Your weight will continue to increase. You can expect to gain 25-35 pounds (11-16 kg) by the end of the pregnancy.  You may begin to get stretch marks on your hips, abdomen, and breasts.  You may urinate more often because the fetus is moving lower into your pelvis and pressing on your bladder.  You may develop or continue to have heartburn. This is caused by increased hormones that slow down muscles in the digestive tract.  You may develop or continue to have constipation because increased hormones slow digestion and cause the muscles that push waste through your intestines to relax.  You may develop hemorrhoids. These are swollen veins (varicose veins) in the rectum that can itch or be painful.  You may develop swollen, bulging veins (varicose veins) in your legs.  You may have increased body aches in the pelvis, back, or thighs. This is due to weight gain and increased hormones that are relaxing your joints.  You may have changes in your hair. These can include thickening of your hair, rapid growth, and changes in texture. Some women also have hair loss during or after pregnancy, or hair that feels dry or thin. Your hair will most likely return to normal after your baby is born.  Your breasts will continue to grow and they will continue to become tender. A yellow fluid (colostrum) may leak from your breasts. This is the first milk you are producing for your baby.  Your belly button may stick out.  You may notice more swelling in your hands,  face, or ankles.  You may have increased tingling or numbness in your hands, arms, and legs. The skin on your belly may also feel numb.  You may feel short of breath because of your expanding uterus.  You may have more problems sleeping. This can be caused by the size of your belly, increased need to urinate, and an increase in your body's metabolism.  You may notice the fetus "dropping," or moving lower in your abdomen (lightening).  You may have increased vaginal discharge.  You may notice your joints feel loose and you may have pain around your pelvic bone. What to expect at prenatal visits You will have prenatal exams every 2 weeks until week 36. Then you will have weekly prenatal exams. During a routine prenatal visit:  You will be weighed to make sure you and the baby are growing normally.  Your blood pressure will be taken.  Your abdomen will be measured to track your baby's growth.  The fetal heartbeat will be listened to.  Any test results from the previous visit will be discussed.  You may have a cervical check near your due date to see if your cervix has softened or thinned (effaced).  You will be tested for Group B streptococcus. This happens between 35 and 37 weeks. Your health care provider may ask you:  What your birth plan is.  How you are feeling.  If you are feeling the baby move.  If you have had any abnormal   symptoms, such as leaking fluid, bleeding, severe headaches, or abdominal cramping.  If you are using any tobacco products, including cigarettes, chewing tobacco, and electronic cigarettes.  If you have any questions. Other tests or screenings that may be performed during your third trimester include:  Blood tests that check for low iron levels (anemia).  Fetal testing to check the health, activity level, and growth of the fetus. Testing is done if you have certain medical conditions or if there are problems during the pregnancy.  Nonstress test  (NST). This test checks the health of your baby to make sure there are no signs of problems, such as the baby not getting enough oxygen. During this test, a belt is placed around your belly. The baby is made to move, and its heart rate is monitored during movement. What is false labor? False labor is a condition in which you feel small, irregular tightenings of the muscles in the womb (contractions) that usually go away with rest, changing position, or drinking water. These are called Braxton Hicks contractions. Contractions may last for hours, days, or even weeks before true labor sets in. If contractions come at regular intervals, become more frequent, increase in intensity, or become painful, you should see your health care provider. What are the signs of labor?  Abdominal cramps.  Regular contractions that start at 10 minutes apart and become stronger and more frequent with time.  Contractions that start on the top of the uterus and spread down to the lower abdomen and back.  Increased pelvic pressure and dull back pain.  A watery or bloody mucus discharge that comes from the vagina.  Leaking of amniotic fluid. This is also known as your "water breaking." It could be a slow trickle or a gush. Let your health care provider know if it has a color or strange odor. If you have any of these signs, call your health care provider right away, even if it is before your due date. Follow these instructions at home: Medicines  Follow your health care provider's instructions regarding medicine use. Specific medicines may be either safe or unsafe to take during pregnancy.  Take a prenatal vitamin that contains at least 600 micrograms (mcg) of folic acid.  If you develop constipation, try taking a stool softener if your health care provider approves. Eating and drinking   Eat a balanced diet that includes fresh fruits and vegetables, whole grains, good sources of protein such as meat, eggs, or tofu,  and low-fat dairy. Your health care provider will help you determine the amount of weight gain that is right for you.  Avoid raw meat and uncooked cheese. These carry germs that can cause birth defects in the baby.  If you have low calcium intake from food, talk to your health care provider about whether you should take a daily calcium supplement.  Eat four or five small meals rather than three large meals a day.  Limit foods that are high in fat and processed sugars, such as fried and sweet foods.  To prevent constipation: ? Drink enough fluid to keep your urine clear or pale yellow. ? Eat foods that are high in fiber, such as fresh fruits and vegetables, whole grains, and beans. Activity  Exercise only as directed by your health care provider. Most women can continue their usual exercise routine during pregnancy. Try to exercise for 30 minutes at least 5 days a week. Stop exercising if you experience uterine contractions.  Avoid heavy lifting.  Do   not exercise in extreme heat or humidity, or at high altitudes.  Wear low-heel, comfortable shoes.  Practice good posture.  You may continue to have sex unless your health care provider tells you otherwise. Relieving pain and discomfort  Take frequent breaks and rest with your legs elevated if you have leg cramps or low back pain.  Take warm sitz baths to soothe any pain or discomfort caused by hemorrhoids. Use hemorrhoid cream if your health care provider approves.  Wear a good support bra to prevent discomfort from breast tenderness.  If you develop varicose veins: ? Wear support pantyhose or compression stockings as told by your healthcare provider. ? Elevate your feet for 15 minutes, 3-4 times a day. Prenatal care  Write down your questions. Take them to your prenatal visits.  Keep all your prenatal visits as told by your health care provider. This is important. Safety  Wear your seat belt at all times when driving.  Make  a list of emergency phone numbers, including numbers for family, friends, the hospital, and police and fire departments. General instructions  Avoid cat litter boxes and soil used by cats. These carry germs that can cause birth defects in the baby. If you have a cat, ask someone to clean the litter box for you.  Do not travel far distances unless it is absolutely necessary and only with the approval of your health care provider.  Do not use hot tubs, steam rooms, or saunas.  Do not drink alcohol.  Do not use any products that contain nicotine or tobacco, such as cigarettes and e-cigarettes. If you need help quitting, ask your health care provider.  Do not use any medicinal herbs or unprescribed drugs. These chemicals affect the formation and growth of the baby.  Do not douche or use tampons or scented sanitary pads.  Do not cross your legs for long periods of time.  To prepare for the arrival of your baby: ? Take prenatal classes to understand, practice, and ask questions about labor and delivery. ? Make a trial run to the hospital. ? Visit the hospital and tour the maternity area. ? Arrange for maternity or paternity leave through employers. ? Arrange for family and friends to take care of pets while you are in the hospital. ? Purchase a rear-facing car seat and make sure you know how to install it in your car. ? Pack your hospital bag. ? Prepare the baby's nursery. Make sure to remove all pillows and stuffed animals from the baby's crib to prevent suffocation.  Visit your dentist if you have not gone during your pregnancy. Use a soft toothbrush to brush your teeth and be gentle when you floss. Contact a health care provider if:  You are unsure if you are in labor or if your water has broken.  You become dizzy.  You have mild pelvic cramps, pelvic pressure, or nagging pain in your abdominal area.  You have lower back pain.  You have persistent nausea, vomiting, or diarrhea.   You have an unusual or bad smelling vaginal discharge.  You have pain when you urinate. Get help right away if:  Your water breaks before 37 weeks.  You have regular contractions less than 5 minutes apart before 37 weeks.  You have a fever.  You are leaking fluid from your vagina.  You have spotting or bleeding from your vagina.  You have severe abdominal pain or cramping.  You have rapid weight loss or weight gain.  You have   shortness of breath with chest pain.  You notice sudden or extreme swelling of your face, hands, ankles, feet, or legs.  Your baby makes fewer than 10 movements in 2 hours.  You have severe headaches that do not go away when you take medicine.  You have vision changes. Summary  The third trimester is from week 28 through week 40, months 7 through 9. The third trimester is a time when the unborn baby (fetus) is growing rapidly.  During the third trimester, your discomfort may increase as you and your baby continue to gain weight. You may have abdominal, leg, and back pain, sleeping problems, and an increased need to urinate.  During the third trimester your breasts will keep growing and they will continue to become tender. A yellow fluid (colostrum) may leak from your breasts. This is the first milk you are producing for your baby.  False labor is a condition in which you feel small, irregular tightenings of the muscles in the womb (contractions) that eventually go away. These are called Braxton Hicks contractions. Contractions may last for hours, days, or even weeks before true labor sets in.  Signs of labor can include: abdominal cramps; regular contractions that start at 10 minutes apart and become stronger and more frequent with time; watery or bloody mucus discharge that comes from the vagina; increased pelvic pressure and dull back pain; and leaking of amniotic fluid. This information is not intended to replace advice given to you by your health  care provider. Make sure you discuss any questions you have with your health care provider. Document Released: 09/07/2001 Document Revised: 01/04/2019 Document Reviewed: 10/19/2016 Elsevier Patient Education  2020 [employer]  

## 2019-05-01 NOTE — Progress Notes (Signed)
Subjective:  Kim Stanton is a 28 y.o. G1P0 at [redacted]w[redacted]d being seen today for ongoing prenatal care.  She is currently monitored for the following issues for this low-risk pregnancy and has Supervision of normal first pregnancy, antepartum; Pyelectasis of fetus on prenatal ultrasound; and Genetic carrier on their problem list.  Patient reports general discomforts of pregnancy.  Contractions: Not present. Vag. Bleeding: None.  Movement: Present. Denies leaking of fluid.   The following portions of the patient's history were reviewed and updated as appropriate: allergies, current medications, past family history, past medical history, past social history, past surgical history and problem list. Problem list updated.  Objective:   Vitals:   05/01/19 1000  BP: 105/70  Pulse: 93  Temp: 97.8 F (36.6 C)  Weight: 188 lb 12.8 oz (85.6 kg)    Fetal Status: Fetal Heart Rate (bpm): 150   Movement: Present     General:  Alert, oriented and cooperative. Patient is in no acute distress.  Skin: Skin is warm and dry. No rash noted.   Cardiovascular: Normal heart rate noted  Respiratory: Normal respiratory effort, no problems with respiration noted  Abdomen: Soft, gravid, appropriate for gestational age. Pain/Pressure: Present     Pelvic:  Cervical exam deferred        Extremities: Normal range of motion.  Edema: Trace  Mental Status: Normal mood and affect. Normal behavior. Normal judgment and thought content.   Urinalysis:      Assessment and Plan:  Pregnancy: G1P0 at [redacted]w[redacted]d  1. Supervision of normal first pregnancy, antepartum Stable Has to reschedule Glucola  2. Pyelectasis of fetus on prenatal ultrasound Stable F/U U/S 05/14/19  3. Genetic carrier S/P genetic counseling  Preterm labor symptoms and general obstetric precautions including but not limited to vaginal bleeding, contractions, leaking of fluid and fetal movement were reviewed in detail with the patient. Please refer to After  Visit Summary for other counseling recommendations.  Return in about 3 weeks (around 05/22/2019) for OB visit.   Chancy Milroy, MD

## 2019-05-01 NOTE — Progress Notes (Signed)
Patient reports fetal movement, with occasional pressure. Pt did not do glucose testing today because she ate at 1 am. 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

## 2019-05-02 ENCOUNTER — Other Ambulatory Visit: Payer: Managed Care, Other (non HMO)

## 2019-05-02 DIAGNOSIS — Z3400 Encounter for supervision of normal first pregnancy, unspecified trimester: Secondary | ICD-10-CM

## 2019-05-03 LAB — CBC
Hematocrit: 34.7 % (ref 34.0–46.6)
Hemoglobin: 11.4 g/dL (ref 11.1–15.9)
MCH: 30 pg (ref 26.6–33.0)
MCHC: 32.9 g/dL (ref 31.5–35.7)
MCV: 91 fL (ref 79–97)
Platelets: 288 10*3/uL (ref 150–450)
RBC: 3.8 x10E6/uL (ref 3.77–5.28)
RDW: 13.2 % (ref 11.7–15.4)
WBC: 7 10*3/uL (ref 3.4–10.8)

## 2019-05-03 LAB — HIV ANTIBODY (ROUTINE TESTING W REFLEX): HIV Screen 4th Generation wRfx: NONREACTIVE

## 2019-05-03 LAB — RPR: RPR Ser Ql: NONREACTIVE

## 2019-05-03 LAB — GLUCOSE TOLERANCE, 2 HOURS W/ 1HR
Glucose, 1 hour: 157 mg/dL (ref 65–179)
Glucose, 2 hour: 141 mg/dL (ref 65–152)
Glucose, Fasting: 78 mg/dL (ref 65–91)

## 2019-05-14 ENCOUNTER — Ambulatory Visit (HOSPITAL_COMMUNITY)
Admission: RE | Admit: 2019-05-14 | Discharge: 2019-05-14 | Disposition: A | Discharge status: Home / Self Care | Payer: Managed Care, Other (non HMO) | Source: Ambulatory Visit | Attending: Obstetrics and Gynecology | Admitting: Obstetrics and Gynecology

## 2019-05-14 ENCOUNTER — Other Ambulatory Visit: Payer: Self-pay

## 2019-05-14 ENCOUNTER — Other Ambulatory Visit (HOSPITAL_COMMUNITY): Payer: Self-pay | Admitting: *Deleted

## 2019-05-14 ENCOUNTER — Ambulatory Visit (HOSPITAL_COMMUNITY): Payer: Managed Care, Other (non HMO) | Admitting: *Deleted

## 2019-05-14 ENCOUNTER — Encounter (HOSPITAL_COMMUNITY): Payer: Self-pay | Admitting: *Deleted

## 2019-05-14 DIAGNOSIS — O358XX0 Maternal care for other (suspected) fetal abnormality and damage, not applicable or unspecified: Secondary | ICD-10-CM

## 2019-05-14 DIAGNOSIS — O359XX0 Maternal care for (suspected) fetal abnormality and damage, unspecified, not applicable or unspecified: Secondary | ICD-10-CM | POA: Diagnosis not present

## 2019-05-14 DIAGNOSIS — O35EXX0 Maternal care for other (suspected) fetal abnormality and damage, fetal genitourinary anomalies, not applicable or unspecified: Secondary | ICD-10-CM

## 2019-05-14 DIAGNOSIS — Z3A30 30 weeks gestation of pregnancy: Secondary | ICD-10-CM

## 2019-05-14 DIAGNOSIS — O99213 Obesity complicating pregnancy, third trimester: Secondary | ICD-10-CM

## 2019-05-14 DIAGNOSIS — Z362 Encounter for other antenatal screening follow-up: Secondary | ICD-10-CM | POA: Diagnosis not present

## 2019-05-22 ENCOUNTER — Telehealth: Payer: Managed Care, Other (non HMO) | Admitting: Obstetrics & Gynecology

## 2019-06-08 ENCOUNTER — Other Ambulatory Visit: Payer: Managed Care, Other (non HMO)

## 2019-06-08 ENCOUNTER — Other Ambulatory Visit: Payer: Self-pay

## 2019-06-08 DIAGNOSIS — Z3400 Encounter for supervision of normal first pregnancy, unspecified trimester: Secondary | ICD-10-CM

## 2019-06-10 LAB — BILE ACIDS, TOTAL: Bile Acids Total: 6.2 umol/L (ref 0.0–10.0)

## 2019-06-11 ENCOUNTER — Encounter: Payer: Self-pay | Admitting: Obstetrics

## 2019-06-11 ENCOUNTER — Telehealth (INDEPENDENT_AMBULATORY_CARE_PROVIDER_SITE_OTHER): Payer: Managed Care, Other (non HMO) | Admitting: Obstetrics

## 2019-06-11 VITALS — BP 110/70 | HR 115

## 2019-06-11 DIAGNOSIS — L299 Pruritus, unspecified: Secondary | ICD-10-CM

## 2019-06-11 DIAGNOSIS — O35EXX0 Maternal care for other (suspected) fetal abnormality and damage, fetal genitourinary anomalies, not applicable or unspecified: Secondary | ICD-10-CM

## 2019-06-11 DIAGNOSIS — Z3400 Encounter for supervision of normal first pregnancy, unspecified trimester: Secondary | ICD-10-CM

## 2019-06-11 DIAGNOSIS — O99713 Diseases of the skin and subcutaneous tissue complicating pregnancy, third trimester: Secondary | ICD-10-CM

## 2019-06-11 DIAGNOSIS — O358XX0 Maternal care for other (suspected) fetal abnormality and damage, not applicable or unspecified: Secondary | ICD-10-CM

## 2019-06-11 DIAGNOSIS — Z3A34 34 weeks gestation of pregnancy: Secondary | ICD-10-CM

## 2019-06-11 DIAGNOSIS — O2686 Pruritic urticarial papules and plaques of pregnancy (PUPPP): Secondary | ICD-10-CM

## 2019-06-11 MED ORDER — URSODIOL 300 MG PO CAPS: 300.0000 mg | ORAL_CAPSULE | Freq: Two times a day (BID) | ORAL | 4 refills | Status: DC

## 2019-06-11 MED ORDER — PREDNISONE 10 MG (21) PO TBPK: ORAL_TABLET | ORAL | 0 refills | Status: DC

## 2019-06-11 NOTE — Progress Notes (Signed)
   TELEHEALTH VIRTUAL OBSTETRICS VISIT ENCOUNTER NOTE  I connected with Kim Stanton on 06/11/19 at  3:30 PM EDT by telephone at home and verified that I am speaking with the correct person using two identifiers.   I discussed the limitations, risks, security and privacy concerns of performing an evaluation and management service by telephone and the availability of in person appointments. I also discussed with the patient that there may be a patient responsible charge related to this service. The patient expressed understanding and agreed to proceed.  Subjective:  Kim Stanton is a 28 y.o. G1P0 at [redacted]w[redacted]d being followed for ongoing prenatal care.  She is currently monitored for the following issues for this high-risk pregnancy and has Supervision of normal first pregnancy, antepartum; Pyelectasis of fetus on prenatal ultrasound; and Genetic carrier on their problem list.  Patient reports severe itching that started out in her stretch marks and has spresd to arms and back . Reports fetal movement. Denies any contractions, bleeding or leaking of fluid.   The following portions of the patient's history were reviewed and updated as appropriate: allergies, current medications, past family history, past medical history, past social history, past surgical history and problem list.   Objective:   General:  Alert, oriented and cooperative.   Mental Status: Normal mood and affect perceived. Normal judgment and thought content.  Rest of physical exam deferred due to type of encounter  Assessment and Plan:  Pregnancy: G1P0 at [redacted]w[redacted]d  1. Supervision of normal first pregnancy, antepartum  2. Pyelectasis of fetus on prenatal ultrasound, resolved  3. Pruritus of pregnancy in third trimester Rx: - Comprehensive metabolic panel; Future - Bile acids, total; Future  4. 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  Dispense: 1 each; Refill: 0   Preterm labor symptoms and general obstetric precautions including but not limited to vaginal bleeding, contractions, leaking of fluid and fetal movement were reviewed in detail with the patient.  I discussed the assessment and treatment plan with the patient. The patient was provided an opportunity to ask questions and all were answered. The patient agreed with the plan and demonstrated an understanding of the instructions. The patient was advised to call back or seek an in-person office evaluation/go to MAU at [facility] for any urgent or concerning symptoms. Please refer to After Visit Summary for other counseling recommendations.   I provided 10 minutes of non-face-to-face time during this encounter.  Return in about 2 weeks (around 06/25/2019) for [facility] in person.  GBS.  CMET..  Future Appointments  Date Time Provider [place]  06/25/2019  2:15 PM [place] 4 WH-MFCUS MFC-US  06/25/2019  2:20 PM [place] MFC-US    Baltazar Najjar, [place] [facility] [place] [place] 06/11/2019

## 2019-06-11 NOTE — Progress Notes (Signed)
S/w pt for mychart visit, pt reports fetal movement with some pressure.

## 2019-06-15 ENCOUNTER — Other Ambulatory Visit: Payer: Self-pay

## 2019-06-15 ENCOUNTER — Other Ambulatory Visit: Payer: Self-pay | Admitting: Obstetrics

## 2019-06-15 ENCOUNTER — Other Ambulatory Visit: Payer: Managed Care, Other (non HMO)

## 2019-06-15 DIAGNOSIS — K831 Obstruction of bile duct: Secondary | ICD-10-CM

## 2019-06-15 DIAGNOSIS — L299 Pruritus, unspecified: Secondary | ICD-10-CM

## 2019-06-15 MED ORDER — URSODIOL 300 MG PO CAPS: 300.0000 mg | ORAL_CAPSULE | Freq: Two times a day (BID) | ORAL | 4 refills | Status: DC

## 2019-06-15 NOTE — Progress Notes (Signed)
Patient here for lab draw.  Complains of severe itching all over, but less on her abdomen after starting a steroid taper on 06-12-2019 for possible PUPPS.  I examined her today and her rash is fine, more like Cholestasis of Pregnancy, even though her bile acids were 6 a week ago.  Bile acids repeated today.  Actigall started for clinical cholestasis.  Shelly Bombard, MD 06/15/2019 10:59 AM

## 2019-06-15 NOTE — Addendum Note (Signed)
Addended by: Baltazar Najjar A on: 06/15/2019 11:48 AM   Modules accepted: Orders
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
## 2019-06-18 LAB — COMPREHENSIVE METABOLIC PANEL
ALT: 13 IU/L (ref 0–32)
AST: 15 IU/L (ref 0–40)
Albumin/Globulin Ratio: 1.6 (ref 1.2–2.2)
Albumin: 3.9 g/dL (ref 3.9–5.0)
Alkaline Phosphatase: 146 IU/L — ABNORMAL HIGH (ref 39–117)
BUN/Creatinine Ratio: 8 — ABNORMAL LOW (ref 9–23)
BUN: 5 mg/dL — ABNORMAL LOW (ref 6–20)
Bilirubin Total: <0.2 mg/dL (ref 0.0–1.2)
CO2: 21 mmol/L (ref 20–29)
Calcium: 9.4 mg/dL (ref 8.7–10.2)
Chloride: 103 mmol/L (ref 96–106)
Creatinine, Ser: 0.61 mg/dL (ref 0.57–1.00)
GFR calc Af Amer: 144 mL/min/{1.73_m2} (ref >59)
GFR calc non Af Amer: 125 mL/min/{1.73_m2} (ref >59)
Globulin, Total: 2.4 g/dL (ref 1.5–4.5)
Glucose: 201 mg/dL — ABNORMAL HIGH (ref 65–99)
Potassium: 3.6 mmol/L (ref 3.5–5.2)
Sodium: 137 mmol/L (ref 134–144)
Total Protein: 6.3 g/dL (ref 6.0–8.5)

## 2019-06-18 LAB — BILE ACIDS, TOTAL: Bile Acids Total: 4.5 umol/L (ref 0.0–10.0)

## 2019-06-25 ENCOUNTER — Ambulatory Visit (HOSPITAL_COMMUNITY): Payer: Managed Care, Other (non HMO) | Admitting: *Deleted

## 2019-06-25 ENCOUNTER — Ambulatory Visit (HOSPITAL_COMMUNITY)
Admission: RE | Admit: 2019-06-25 | Discharge: 2019-06-25 | Disposition: A | Discharge status: Home / Self Care | Payer: Managed Care, Other (non HMO) | Source: Ambulatory Visit | Attending: Obstetrics and Gynecology | Admitting: Obstetrics and Gynecology

## 2019-06-25 ENCOUNTER — Other Ambulatory Visit: Payer: Self-pay

## 2019-06-25 ENCOUNTER — Encounter (HOSPITAL_COMMUNITY): Payer: Self-pay | Admitting: *Deleted

## 2019-06-25 DIAGNOSIS — O358XX0 Maternal care for other (suspected) fetal abnormality and damage, not applicable or unspecified: Secondary | ICD-10-CM

## 2019-06-25 DIAGNOSIS — Z3A36 36 weeks gestation of pregnancy: Secondary | ICD-10-CM

## 2019-06-25 DIAGNOSIS — O359XX0 Maternal care for (suspected) fetal abnormality and damage, unspecified, not applicable or unspecified: Secondary | ICD-10-CM | POA: Diagnosis not present

## 2019-06-25 DIAGNOSIS — O35EXX0 Maternal care for other (suspected) fetal abnormality and damage, fetal genitourinary anomalies, not applicable or unspecified: Secondary | ICD-10-CM

## 2019-06-25 DIAGNOSIS — Z362 Encounter for other antenatal screening follow-up: Secondary | ICD-10-CM

## 2019-06-28 ENCOUNTER — Telehealth: Payer: Self-pay

## 2019-06-28 NOTE — Telephone Encounter (Signed)
Patient called the triage line stating that her stretch marks are really inflamed and itchy, and wants to know if there is another ointment that she can use for her symptoms. She states that she is still using the medication previously prescribed for this, and has changed her detergent, and soaps to non scented and for sensitive skin. She states that she is not sure what is causing her sx. She has also tried Aveeno anti itch cream and the anti itch Benadryl cream with no relief. Please advise

## 2019-06-29 ENCOUNTER — Other Ambulatory Visit: Payer: Self-pay | Admitting: Obstetrics

## 2019-06-29 NOTE — Telephone Encounter (Signed)
Recommend Calamine Lotion

## 2019-07-02 ENCOUNTER — Other Ambulatory Visit: Payer: Self-pay | Admitting: Obstetrics

## 2019-07-02 ENCOUNTER — Encounter: Payer: Self-pay | Admitting: *Deleted

## 2019-07-02 DIAGNOSIS — O2686 Pruritic urticarial papules and plaques of pregnancy (PUPPP): Secondary | ICD-10-CM

## 2019-07-02 MED ORDER — PREDNISONE 10 MG (21) PO TBPK: ORAL_TABLET | ORAL | 0 refills | Status: DC

## 2019-07-03 ENCOUNTER — Ambulatory Visit (INDEPENDENT_AMBULATORY_CARE_PROVIDER_SITE_OTHER): Payer: Managed Care, Other (non HMO)

## 2019-07-03 ENCOUNTER — Other Ambulatory Visit: Payer: Self-pay

## 2019-07-03 VITALS — BP 122/78 | HR 114 | Wt 200.0 lb

## 2019-07-03 DIAGNOSIS — Z113 Encounter for screening for infections with a predominantly sexual mode of transmission: Secondary | ICD-10-CM | POA: Diagnosis not present

## 2019-07-03 DIAGNOSIS — Z3403 Encounter for supervision of normal first pregnancy, third trimester: Secondary | ICD-10-CM

## 2019-07-03 DIAGNOSIS — Z23 Encounter for immunization: Secondary | ICD-10-CM

## 2019-07-03 DIAGNOSIS — Z3400 Encounter for supervision of normal first pregnancy, unspecified trimester: Secondary | ICD-10-CM

## 2019-07-03 DIAGNOSIS — Z3A38 38 weeks gestation of pregnancy: Secondary | ICD-10-CM

## 2019-07-03 NOTE — Patient Instructions (Signed)

## 2019-07-03 NOTE — Progress Notes (Signed)
Patient reports fetal movement with some pressure. Pt requests tdap vaccine today.

## 2019-07-03 NOTE — Progress Notes (Signed)
   PRENATAL VISIT NOTE  Subjective:  Kim Stanton is a 28 y.o. G1P0 at [redacted]w[redacted]d who presents today for routine prenatal care.  She is currently being monitored for supervision of a low-risk pregnancy with problems as listed below.  Patient has no pregnancy related concerns and endorses fetal movement.  She denies vaginal concerns including discharge, bleeding, leaking, itching, and burning.   Patient Active Problem List   Diagnosis Date Noted  . Genetic carrier 05/01/2019  . Pyelectasis of fetus on prenatal ultrasound 02/21/2019  . Supervision of normal first pregnancy, antepartum 12/21/2018    The following portions of the patient's history were reviewed and updated as appropriate: allergies, current medications, past family history, past medical history, past social history, past surgical history and problem list. Problem list updated.  Objective:   Vitals:   07/03/19 1602  BP: 122/78  Pulse: (!) 114  Weight: 200 lb (90.7 kg)    Fetal Status: Fetal Heart Rate (bpm): 145 Fundal Height: 39 cm Movement: Present  Presentation: Vertex  General:  Alert, oriented and cooperative. Patient is in no acute distress.  Skin: Skin is warm and dry.   Cardiovascular: Regular rate and rhythm.  Respiratory: Normal respiratory effort. CTA-Bilaterally  Abdomen: Soft, gravid, appropriate for gestational age.  Pelvic: Cervical exam performed Dilation: 1 Effacement (%): 50 Station: Ballotable  Extremities: Normal range of motion.  Edema: Trace  Mental Status: Normal mood and affect. Normal behavior. Normal judgment and thought content.   Assessment and Plan:  Pregnancy: G1P0 at [redacted]w[redacted]d  1. Supervision of normal first pregnancy, antepartum -anticipatory guidance for upcoming appts. -Reviewed need for GBS and STD cultures today. -Educated on GBS bacteria including what it is, why we test, and how and when we treat if needed. -Discussed releasing of results to mychart. -Discussed and reviewed  postpartum planning including contraception, pediatricians, and infant feedings. *Patient desires to breastfeed and is considering Nexplanon for contraception. -Reviewed possibility of PD induction.  Pt to return in 2 weeks for in person evaluation and scheduling as appropriate.  -Extensive discussion regarding term labor symptoms including 5/1/1 and contraction perceptions.    Term labor symptoms and general obstetric precautions including but not limited to vaginal bleeding, contractions, leaking of fluid and fetal movement were reviewed with the patient.  Please refer to After Visit Summary for other counseling recommendations.  Return in about 1 week (around 07/10/2019) for LR-ROB via Virtual.  Future Appointments  Date Time Provider [place]  07/10/2019 11:15 AM Shelly Bombard, MD [place] None    Maryann Conners, CNM 07/03/2019, 5:06 PM

## 2019-07-05 LAB — STREP GP B NAA: Strep Gp B NAA: NEGATIVE

## 2019-07-10 ENCOUNTER — Telehealth (INDEPENDENT_AMBULATORY_CARE_PROVIDER_SITE_OTHER): Payer: Managed Care, Other (non HMO) | Admitting: Obstetrics

## 2019-07-10 ENCOUNTER — Encounter: Payer: Self-pay | Admitting: Obstetrics

## 2019-07-10 VITALS — BP 129/75 | HR 95

## 2019-07-10 DIAGNOSIS — O358XX0 Maternal care for other (suspected) fetal abnormality and damage, not applicable or unspecified: Secondary | ICD-10-CM

## 2019-07-10 DIAGNOSIS — O35EXX0 Maternal care for other (suspected) fetal abnormality and damage, fetal genitourinary anomalies, not applicable or unspecified: Secondary | ICD-10-CM

## 2019-07-10 DIAGNOSIS — Z3400 Encounter for supervision of normal first pregnancy, unspecified trimester: Secondary | ICD-10-CM

## 2019-07-10 DIAGNOSIS — Z3A39 39 weeks gestation of pregnancy: Secondary | ICD-10-CM

## 2019-07-10 NOTE — Progress Notes (Signed)
Patient reports fetal movement with pressure and backaches.

## 2019-07-10 NOTE — Progress Notes (Signed)
TELEHEALTH OBSTETRICS PRENATAL VIRTUAL VIDEO VISIT ENCOUNTER NOTE  Provider location: [facility] [place]   I connected with Kim Stanton on 07/10/19 at 11:15 AM EDT by OB MyChart Video Encounter at home and verified that I am speaking with the correct person using two identifiers.   I discussed the limitations, risks, security and privacy concerns of performing an evaluation and management service virtually and the availability of in person appointments. I also discussed with the patient that there may be a patient responsible charge related to this service. The patient expressed understanding and agreed to proceed. Subjective:  Kim Stanton is a 28 y.o. G1P0 at [redacted]w[redacted]d being seen today for ongoing prenatal care.  She is currently monitored for the following issues for this low-risk pregnancy and has Supervision of normal first pregnancy, antepartum; Pyelectasis of fetus on prenatal ultrasound; and Genetic carrier on their problem list.  Patient reports no complaints.  Contractions: Not present. Vag. Bleeding: None.  Movement: Present. Denies any leaking of fluid.   The following portions of the patient's history were reviewed and updated as appropriate: allergies, current medications, past family history, past medical history, past social history, past surgical history and problem list.   Objective:   Vitals:   07/10/19 1120  BP: 129/75  Pulse: 95    Fetal Status:     Movement: Present     General:  Alert, oriented and cooperative. Patient is in no acute distress.  Respiratory: Normal respiratory effort, no problems with respiration noted  Mental Status: Normal mood and affect. Normal behavior. Normal judgment and thought content.  Rest of physical exam deferred due to type of encounter  Imaging: [place] Mfm Ob Follow Up  Result Date: 06/25/2019 ----------------------------------------------------------------------  OBSTETRICS REPORT                        (Signed Final 06/25/2019 03:43 pm) ---------------------------------------------------------------------- Patient Info  ID #:       [id number]                          D.O.B.:  1991-05-12 (28 yrs)  Name:       Kim Stanton                  Visit Date: 06/25/2019 02:31 pm ---------------------------------------------------------------------- Performed By  Performed By:     Lenise Arena        Ref. Address:     Faculty                    RDMS  Attending:        Ma Rings MD         [facility]  Referred By:      Rolm Bookbinder CNM ---------------------------------------------------------------------- Orders   #  Description  Code         Ordered By   1  US MFM OB FOLLOW UP                  [id number]     Noralee SpaceAVI SHANKAR  ----------------------------------------------------------------------   #  Order #                    Accession #                 Episode #   1  [id number]                  [id number]                  [id number]  ---------------------------------------------------------------------- Indications   Fetal abnormality - other known or             O35.9XX0   suspected (EIFLV, LT pyelectasis)   Antenatal follow-up for nonvisualized fetal    Z36.2   anatomy   [redacted] weeks gestation of pregnancy                Z3A.36  ---------------------------------------------------------------------- Vital Signs  Weight (lb): 188                               Height:        5'2"  BMI:         34.38 ---------------------------------------------------------------------- Fetal Evaluation  Num Of Fetuses:         1  Fetal Heart Rate(bpm):  154  Cardiac Activity:       Observed  Presentation:           Cephalic  Placenta:               Anterior  P. Cord Insertion:      Previously Visualized  Amniotic Fluid  AFI FV:      Within normal limits  AFI Sum(cm)     %Tile       Largest Pocket(cm)   12.11           39          5.32  RUQ(cm)       RLQ(cm)       LUQ(cm)        LLQ(cm)  5.32          2.72          1.96           2.11 ---------------------------------------------------------------------- Biometry  BPD:        86  mm     G. Age:  34w 5d         10  %    CI:        76.03   %    70 - 86                                                          FL/HC:      21.7   %    20.8 - 22.6  HC:      312.6  mm     G. Age:  35w 0d          2  %  HC/AC:      0.88        0.92 - 1.05  AC:      354.3  mm     G. Age:  39w 2d         99  %    FL/BPD:     79.0   %    71 - 87  FL:       67.9  mm     G. Age:  34w 6d          8  %    FL/AC:      19.2   %    20 - 24  HUM:      57.6  mm     G. Age:  33w 3d        < 5  %  Est. FW:    3167  gm           7 lb     67  % ---------------------------------------------------------------------- OB History  Gravidity:    1 ---------------------------------------------------------------------- Gestational Age  LMP:           36w 6d        Date:  10/10/18                 EDD:   07/17/19  U/S Today:     36w 0d                                        EDD:   07/23/19  Best:          36w 6d     Det. By:  LMP  (10/10/18)          EDD:   07/17/19 ---------------------------------------------------------------------- Anatomy  Cranium:               Appears normal         LVOT:                   Previously seen  Cavum:                 Appears normal         Aortic Arch:            Previously seen  Ventricles:            Previously seen        Ductal Arch:            Previously seen  Choroid Plexus:        Previously seen        Diaphragm:              Appears normal  Cerebellum:            Previously seen        Stomach:                Appears normal, left                                                                        sided  Posterior Fossa:  Previously seen        Abdomen:                Appears normal  Nuchal Fold:           Not applicable (>20    Abdominal Wall:          Previously seen                         wks GA)  Face:                  Orbits and profile     Cord Vessels:           Previously seen                         previously seen  Lips:                  Previously seen        Kidneys:                Left sided                                                                        pyelectasis, 7.2                                                                        mm  Palate:                Previously seen        Bladder:                Appears normal  Thoracic:              Appears normal         Spine:                  Previously seen  Heart:                 Previously seen        Upper Extremities:      Previously seen  RVOT:                  Previously seen        Lower Extremities:      Previously seen ---------------------------------------------------------------------- Cervix Uterus Adnexa  Cervix  Not visualized (advanced GA >24wks) ---------------------------------------------------------------------- Comments  This patient was seen for a follow up growth scan due to left  pyelectasis noted during her prior ultrasound exams.  She  denies any problems since her last exam.  She was informed that the fetal growth and amniotic fluid  level appears appropriate for her gestational age.  The left renal pelvis measures 0.7 cm dilated today.  The  right fetal kidney appears within normal limits.  The patient  was reassured that the left renal pelvis  dilatation is most likely  a normal variant at her current gestational age.  She was  advised to notify her pediatrician after delivery regarding the  left pyelectasis that was noted during her prenatal  ultrasounds.  Her pediatrician will order additional imaging  studies of the baby's kidneys after birth if necessary.  Follow-up as indicated. ----------------------------------------------------------------------                   Ma Rings, MD Electronically Signed Final Report   06/25/2019 03:43 pm  ----------------------------------------------------------------------   Assessment and Plan:  Pregnancy: G1P0 at [redacted]w[redacted]d 1. Supervision of normal first pregnancy, antepartum  2. Pyelectasis of fetus on prenatal ultrasound - 0.7 cm.  Reassured by MFM that this is probably normal variant at her gestational age - she was advised by MFM to notify her Pediatrician after delivery regarding the left pyelectasis noted on her prenatal ultrasounds for follow up imaging studies if necessary.  Term labor symptoms and general obstetric precautions including but not limited to vaginal bleeding, contractions, leaking of fluid and fetal movement were reviewed in detail with the patient. I discussed the assessment and treatment plan with the patient. The patient was provided an opportunity to ask questions and all were answered. The patient agreed with the plan and demonstrated an understanding of the instructions. The patient was advised to call back or seek an in-person office evaluation/go to MAU at [facility] for any urgent or concerning symptoms. Please refer to After Visit Summary for other counseling recommendations.   I provided 10 minutes of face-to-face time during this encounter.  Return in about 1 week (around 07/17/2019) for ROB.    Coral Ceo, MD Center for [facility], [facility] 07/10/2019

## 2019-07-11 ENCOUNTER — Telehealth (HOSPITAL_COMMUNITY): Payer: Self-pay | Admitting: *Deleted

## 2019-07-11 ENCOUNTER — Encounter (HOSPITAL_COMMUNITY): Payer: Self-pay | Admitting: *Deleted

## 2019-07-11 NOTE — Telephone Encounter (Signed)
Preadmission screen  

## 2019-07-12 LAB — CERVICOVAGINAL ANCILLARY ONLY
Chlamydia: NEGATIVE
Comment: NEGATIVE
Comment: NORMAL
Neisseria Gonorrhea: NEGATIVE

## 2019-07-16 ENCOUNTER — Inpatient Hospital Stay (HOSPITAL_COMMUNITY): Payer: Managed Care, Other (non HMO) | Admitting: Anesthesiology

## 2019-07-16 ENCOUNTER — Other Ambulatory Visit: Payer: Self-pay

## 2019-07-16 ENCOUNTER — Inpatient Hospital Stay (HOSPITAL_COMMUNITY)
Admission: AD | Admit: 2019-07-16 | Discharge: 2019-07-19 | DRG: 805 | Disposition: A | Discharge status: Home / Self Care | Payer: Managed Care, Other (non HMO) | Attending: Family Medicine | Admitting: Family Medicine

## 2019-07-16 ENCOUNTER — Encounter (HOSPITAL_COMMUNITY): Payer: Self-pay

## 2019-07-16 DIAGNOSIS — Z3A39 39 weeks gestation of pregnancy: Secondary | ICD-10-CM | POA: Diagnosis not present

## 2019-07-16 DIAGNOSIS — O2662 Liver and biliary tract disorders in childbirth: Secondary | ICD-10-CM | POA: Diagnosis present

## 2019-07-16 DIAGNOSIS — K831 Obstruction of bile duct: Secondary | ICD-10-CM | POA: Diagnosis present

## 2019-07-16 DIAGNOSIS — Z87891 Personal history of nicotine dependence: Secondary | ICD-10-CM | POA: Diagnosis not present

## 2019-07-16 DIAGNOSIS — Z20828 Contact with and (suspected) exposure to other viral communicable diseases: Secondary | ICD-10-CM | POA: Diagnosis present

## 2019-07-16 DIAGNOSIS — R030 Elevated blood-pressure reading, without diagnosis of hypertension: Secondary | ICD-10-CM | POA: Diagnosis present

## 2019-07-16 DIAGNOSIS — O4290 Premature rupture of membranes, unspecified as to length of time between rupture and onset of labor, unspecified weeks of gestation: Secondary | ICD-10-CM

## 2019-07-16 DIAGNOSIS — O35EXX0 Maternal care for other (suspected) fetal abnormality and damage, fetal genitourinary anomalies, not applicable or unspecified: Secondary | ICD-10-CM | POA: Diagnosis present

## 2019-07-16 DIAGNOSIS — O358XX0 Maternal care for other (suspected) fetal abnormality and damage, not applicable or unspecified: Secondary | ICD-10-CM | POA: Diagnosis present

## 2019-07-16 DIAGNOSIS — Z30017 Encounter for initial prescription of implantable subdermal contraceptive: Secondary | ICD-10-CM | POA: Diagnosis not present

## 2019-07-16 DIAGNOSIS — O26649 Intrahepatic cholestasis of pregnancy, unspecified trimester: Secondary | ICD-10-CM

## 2019-07-16 DIAGNOSIS — O26619 Liver and biliary tract disorders in pregnancy, unspecified trimester: Secondary | ICD-10-CM

## 2019-07-16 DIAGNOSIS — Z148 Genetic carrier of other disease: Secondary | ICD-10-CM

## 2019-07-16 DIAGNOSIS — Z3400 Encounter for supervision of normal first pregnancy, unspecified trimester: Secondary | ICD-10-CM

## 2019-07-16 LAB — CBC
HCT: 38.4 % (ref 36.0–46.0)
Hemoglobin: 12.8 g/dL (ref 12.0–15.0)
MCH: 30 pg (ref 26.0–34.0)
MCHC: 33.3 g/dL (ref 30.0–36.0)
MCV: 89.9 fL (ref 80.0–100.0)
Platelets: 333 10*3/uL (ref 150–400)
RBC: 4.27 MIL/uL (ref 3.87–5.11)
RDW: 14.6 % (ref 11.5–15.5)
WBC: 7 10*3/uL (ref 4.0–10.5)
nRBC: 0 % (ref 0.0–0.2)

## 2019-07-16 LAB — TYPE AND SCREEN
ABO/RH(D): AB POS
Antibody Screen: NEGATIVE

## 2019-07-16 LAB — COMPREHENSIVE METABOLIC PANEL
ALT: 13 U/L (ref 0–44)
AST: 25 U/L (ref 15–41)
Albumin: 3.4 g/dL — ABNORMAL LOW (ref 3.5–5.0)
Alkaline Phosphatase: 196 U/L — ABNORMAL HIGH (ref 38–126)
Anion gap: 11 (ref 5–15)
BUN: 14 mg/dL (ref 6–20)
CO2: 19 mmol/L — ABNORMAL LOW (ref 22–32)
Calcium: 9.4 mg/dL (ref 8.9–10.3)
Chloride: 104 mmol/L (ref 98–111)
Creatinine, Ser: 0.69 mg/dL (ref 0.44–1.00)
GFR calc Af Amer: >60 mL/min (ref >60)
GFR calc non Af Amer: >60 mL/min (ref >60)
Glucose, Bld: 91 mg/dL (ref 70–99)
Potassium: 4 mmol/L (ref 3.5–5.1)
Sodium: 134 mmol/L — ABNORMAL LOW (ref 135–145)
Total Bilirubin: 0.4 mg/dL (ref 0.3–1.2)
Total Protein: 6.5 g/dL (ref 6.5–8.1)

## 2019-07-16 LAB — POCT FERN TEST
POCT Fern Test: POSITIVE
POCT Fern Test: POSITIVE

## 2019-07-16 LAB — SARS CORONAVIRUS 2 BY RT PCR (HOSPITAL ORDER, PERFORMED IN ~~LOC~~ HOSPITAL LAB): SARS Coronavirus 2: NEGATIVE

## 2019-07-16 MED ORDER — URSODIOL 300 MG PO CAPS
300.0000 mg | ORAL_CAPSULE | Freq: Two times a day (BID) | ORAL | Status: DC
  Filled 2019-07-16: qty 1

## 2019-07-16 MED ORDER — OXYTOCIN 40 UNITS IN NORMAL SALINE INFUSION - SIMPLE MED
2.5000 [IU]/h | INTRAVENOUS | Status: DC
  Filled 2019-07-16: qty 1000

## 2019-07-16 MED ORDER — LACTATED RINGERS IV SOLN: 500.0000 mL | Freq: Once | INTRAVENOUS | Status: DC

## 2019-07-16 MED ORDER — FENTANYL CITRATE (PF) 100 MCG/2ML IJ SOLN
INTRAMUSCULAR | Status: AC
  Administered 2019-07-16: 100 ug via INTRAVENOUS
  Filled 2019-07-16: qty 2

## 2019-07-16 MED ORDER — ONDANSETRON HCL 4 MG/2ML IJ SOLN
4.0000 mg | Freq: Four times a day (QID) | INTRAMUSCULAR | Status: DC | PRN
  Administered 2019-07-17: 4 mg via INTRAVENOUS
  Filled 2019-07-16: qty 2

## 2019-07-16 MED ORDER — OXYTOCIN BOLUS FROM INFUSION
500.0000 mL | Freq: Once | INTRAVENOUS | Status: AC
  Administered 2019-07-17: 500 mL via INTRAVENOUS

## 2019-07-16 MED ORDER — FENTANYL CITRATE (PF) 100 MCG/2ML IJ SOLN: 50.0000 ug | Freq: Once | INTRAMUSCULAR | Status: DC

## 2019-07-16 MED ORDER — EPHEDRINE 5 MG/ML INJ: 10.0000 mg | INTRAVENOUS | Status: DC | PRN

## 2019-07-16 MED ORDER — PHENYLEPHRINE 40 MCG/ML (10ML) SYRINGE FOR IV PUSH (FOR BLOOD PRESSURE SUPPORT): 80.0000 ug | PREFILLED_SYRINGE | INTRAVENOUS | Status: DC | PRN

## 2019-07-16 MED ORDER — LIDOCAINE HCL (PF) 1 % IJ SOLN
INTRAMUSCULAR | Status: DC | PRN
  Administered 2019-07-16 (×2): 4 mL via EPIDURAL
  Administered 2019-07-17: 30 mL

## 2019-07-16 MED ORDER — FENTANYL CITRATE (PF) 100 MCG/2ML IJ SOLN
100.0000 ug | Freq: Once | INTRAMUSCULAR | Status: AC
  Administered 2019-07-16: 20:00:00 100 ug via INTRAVENOUS

## 2019-07-16 MED ORDER — LACTATED RINGERS IV SOLN: INTRAVENOUS | Status: DC

## 2019-07-16 MED ORDER — SOD CITRATE-CITRIC ACID 500-334 MG/5ML PO SOLN: 30.0000 mL | ORAL | Status: DC | PRN

## 2019-07-16 MED ORDER — LACTATED RINGERS IV SOLN: 500.0000 mL | INTRAVENOUS | Status: DC | PRN

## 2019-07-16 MED ORDER — LIDOCAINE HCL (PF) 1 % IJ SOLN
30.0000 mL | INTRAMUSCULAR | Status: DC | PRN
  Filled 2019-07-16: qty 30

## 2019-07-16 MED ORDER — ACETAMINOPHEN 325 MG PO TABS
650.0000 mg | ORAL_TABLET | ORAL | Status: DC | PRN
  Administered 2019-07-17: 650 mg via ORAL
  Filled 2019-07-16: qty 2

## 2019-07-16 MED ORDER — DIPHENHYDRAMINE HCL 50 MG/ML IJ SOLN: 12.5000 mg | INTRAMUSCULAR | Status: DC | PRN

## 2019-07-16 MED ORDER — FENTANYL-BUPIVACAINE-NACL 0.5-0.125-0.9 MG/250ML-% EP SOLN
12.0000 mL/h | EPIDURAL | Status: DC | PRN
  Filled 2019-07-16: qty 250

## 2019-07-16 MED ORDER — SODIUM CHLORIDE (PF) 0.9 % IJ SOLN
INTRAMUSCULAR | Status: DC | PRN
  Administered 2019-07-16: 12 mL/h via EPIDURAL

## 2019-07-16 NOTE — MAU Provider Note (Signed)
First Provider Initiated Contact with Patient 07/16/19 1829       S: [name] is a 28 y.o. G1P0 at [redacted]w[redacted]d  who presents to MAU today complaining of leaking of fluid since 2 hours prior to arrival. She denies vaginal bleeding. She endorses contractions. She reports normal fetal movement.    O: BP (!) 110/97   Pulse (!) 157   Temp 98.6 F (37 C)   Resp 20   LMP 10/10/2018 (Exact Date)   SpO2 99%  GENERAL: Well-developed, well-nourished female in no acute distress.  HEAD: Normocephalic, atraumatic.  CHEST: Normal effort of breathing, regular heart rate ABDOMEN: Soft, nontender, gravid PELVIC: Normal external female genitalia. Vagina is pink and rugated. Cervix with normal contour, no lesions. Normal discharge.  Positive pooling clear fluid.   Cervical exam:  Dilation: 2 Effacement (%): 70 Presentation: Vertex Exam by:: Reed Pandy, RN   Fetal Monitoring: Baseline: 145 Variability: moderate Accelerations: 15x15 Decelerations: none Contractions: Q2-3 mins  Pt informed that the ultrasound is considered a limited OB ultrasound and is not intended to be a complete ultrasound exam.  Patient also informed that the ultrasound is not being completed with the intent of assessing for fetal or placental anomalies or any pelvic abnormalities.  Explained that the purpose of today's ultrasound is to assess for  presentation.  Patient acknowledges the purpose of the exam and the limitations of the study.  Cephalic   Results for orders placed or performed during the hospital encounter of 07/16/19 (from the past 24 hour(s))  POCT fern test     Status: Abnormal   Collection Time: 07/16/19  6:59 PM  Result Value Ref Range   POCT Fern Test Positive = ruptured amniotic membanes      A: SIUP at [redacted]w[redacted]d  SROM  P: Admit to birthing suites  Elevated BPs, currently asymptomatic, will order PEC labs GBS negative Pt wants epidural BSUS for presentation as nurse couldn't tell on exam -  cephalic  Jorje Guild, NP 07/16/2019 7:00 PM

## 2019-07-16 NOTE — MAU Note (Addendum)
Pt presents to MAU with c/o ctx that started about 1630. She reports no bleeding but felt leaking today at 1600. +FM

## 2019-07-16 NOTE — Anesthesia Preprocedure Evaluation (Signed)
Anesthesia Evaluation  Patient identified by MRN, date of birth, ID band Patient awake    Reviewed: Allergy & Precautions, Patient's Chart, lab work & pertinent test results  History of Anesthesia Complications Negative for: history of anesthetic complications  Airway Mallampati: II  TM Distance: >3 FB Neck ROM: Full    Dental no notable dental hx.    Pulmonary former smoker,    Pulmonary exam normal        Cardiovascular negative cardio ROS Normal cardiovascular exam     Neuro/Psych negative neurological ROS  negative psych ROS   GI/Hepatic negative GI ROS, Neg liver ROS,   Endo/Other  negative endocrine ROS  Renal/GU negative Renal ROS     Musculoskeletal negative musculoskeletal ROS (+)   Abdominal   Peds  Hematology negative hematology ROS (+)   Anesthesia Other Findings   Reproductive/Obstetrics (+) Pregnancy                             Anesthesia Physical Anesthesia Plan  ASA: II  Anesthesia Plan: Epidural   Post-op Pain Management:    Induction:   PONV Risk Score and Plan: Treatment may vary due to age or medical condition  Airway Management Planned: Natural Airway  Additional Equipment:   Intra-op Plan:   Post-operative Plan:   Informed Consent: I have reviewed the patients History and Physical, chart, labs and discussed the procedure including the risks, benefits and alternatives for the proposed anesthesia with the patient or authorized representative who has indicated his/her understanding and acceptance.       Plan Discussed with:   Anesthesia Plan Comments:         Anesthesia Quick Evaluation

## 2019-07-16 NOTE — Anesthesia Procedure Notes (Signed)
Epidural Patient location during procedure: OB Start time: 07/16/2019 8:20 PM End time: 07/16/2019 8:25 PM  Staffing Anesthesiologist: Brennan Bailey, MD Performed: anesthesiologist   Preanesthetic Checklist Completed: patient identified, pre-op evaluation, timeout performed, IV checked, risks and benefits discussed and monitors and equipment checked  Epidural Patient position: sitting Prep: site prepped and draped and DuraPrep Patient monitoring: continuous pulse ox, blood pressure and heart rate Approach: midline Location: L3-L4 Injection technique: LOR air  Needle:  Needle type: Tuohy  Needle gauge: 17 G Needle length: 9 cm Needle insertion depth: 7 cm Catheter type: closed end flexible Catheter size: 19 Gauge Catheter at skin depth: 11 cm Test dose: negative and Other (1% lidocaine)  Assessment Events: blood not aspirated, injection not painful, no injection resistance, negative IV test and no paresthesia  Additional Notes Patient identified. Risks, benefits, and alternatives discussed with patient including but not limited to bleeding, infection, nerve damage, paralysis, failed block, incomplete pain control, headache, blood pressure changes, nausea, vomiting, reactions to medication, itching, and postpartum back pain. Confirmed with bedside nurse the patient's most recent platelet count. Confirmed with patient that they are not currently taking any anticoagulation, have any bleeding history, or any family history of bleeding disorders. Patient expressed understanding and wished to proceed. All questions were answered. Sterile technique was used throughout the entire procedure. Please see nursing notes for vital signs.   Crisp LOR after 2 needle redirections. Test dose was given through epidural catheter and negative prior to continuing to dose epidural or start infusion. Warning signs of high block given to the patient including shortness of breath, tingling/numbness in  hands, complete motor block, or any concerning symptoms with instructions to call for help. Patient was given instructions on fall risk and not to get out of bed. All questions and concerns addressed with instructions to call with any issues or inadequate analgesia.  Reason for block:procedure for pain

## 2019-07-16 NOTE — H&P (Addendum)
LABOR AND DELIVERY ADMISSION HISTORY AND PHYSICAL NOTE  Kim Stanton is a 28 y.o. female G1P0 with IUP at 45w6dby LMP EDD 07/17/19 presenting for SROM, latent labor, and elevated blood pressure. 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   Endorses LOF since 1600 hours, subsequent painful contractions q 3-4 min, and positive FM. Endorses HA off and on for 3 days (responds to APAP) and blurry vision (attributes to pain medication she just received). No vaginal bleeding. BP elevated X1  to 146/100 which has since normalized to 102/70.     Prenatal History/Complications:  Itching off and on w/rash, bile acids normal, placed on prednisone and actigall.  Past Medical History: Past Medical History:  Diagnosis Date  . Encounter for supervision of normal pregnancy, antepartum 12/21/2018    Nursing Staff Provider Office Location  [facility] Dating   LMP 10/10/2018 Language   English Anatomy [place] 11/25/2018 Flu Vaccine   Declined 12/21/2018 Genetic Screen  NIPS:   AFP:   First Screen:  Quad:   TDaP vaccine    Hgb A1C or  GTT Early  Third trimester  Rhogam     LAB RESULTS  Feeding Plan  Breast Blood Type --/--/AB POS Performed at [facility] [place]  . Medical history non-contributory     Past Surgical History: Past Surgical History:  Procedure Laterality Date  . NO PAST SURGERIES    . WISDOM TOOTH EXTRACTION      Obstetrical History: OB History    Gravida  1   Para      Term      Preterm      AB      Living        SAB      TAB      Ectopic      Multiple      Live Births              Social History: Social History   Socioeconomic History  . Marital status: Single    Spouse name: Not on file  . Number of children: Not on file  . Years of education: Not on file  . Highest education level: Not on file  Occupational History  . Not on file  Social Needs  . Financial resource strain: Not on  file  . Food insecurity    Worry: Not on file    Inability: Not on file  . Transportation needs    Medical: Not on file    Non-medical: Not on file  Tobacco Use  . Smoking status: Former Smoker    Types: Cigarettes    Quit date: 11/09/2018    Years since quitting: 0.6  . Smokeless tobacco: Never Used  Substance and Sexual Activity  . Alcohol use: Not Currently    Alcohol/week: 2.0 standard drinks    Types: 2 Shots of liquor per week    Comment: often WEEKENDS (05/30/18)  . Drug use: Never  . Sexual activity: Yes    Partners: Male  Lifestyle  . Physical activity    Days per week: Not on file    Minutes per session: Not on file  . Stress: Not on file  Relationships  . Social coHerbalistn phone: Not on file    Gets together: Not on file    Attends religious service: Not on file    Active member of club  or organization: Not on file    Attends meetings of clubs or organizations: Not on file    Relationship status: Not on file  Other Topics Concern  . Not on file  Social History Narrative  . Not on file    Family History: Family History  Problem Relation Age of Onset  . Cancer Maternal Grandmother        Breast  . Diabetes Maternal Grandmother     Allergies: Allergies  Allergen Reactions  . Lactose Intolerance (Gi) Other (See Comments)    Upset stomach    Medications Prior to Admission  Medication Sig Dispense Refill Last Dose  . Blood Pressure Monitor KIT 1 Device by Does not apply route once a week. 1 each 0   . predniSONE (STERAPRED UNI-PAK 21 TAB) 10 MG (21) TBPK tablet Take as directed:  Taper 69m to 145mover 6 days. (Patient not taking: Reported on 07/10/2019) 1 each 0   . Prenatal Vit-Fe Fumarate-FA (PRENATAL VITAMINS PLUS PO) Take by mouth.     . terconazole (TERAZOL 3) 0.8 % vaginal cream Place 1 applicator vaginally at bedtime. (Patient not taking: Reported on 05/01/2019) 20 g 0   . ursodiol (ACTIGALL) 300 MG capsule Take 1 capsule (300 mg  total) by mouth 2 (two) times daily. 60 capsule 4      Review of Systems   All systems reviewed and negative except as stated in HPI  Blood pressure 107/60, pulse 89, temperature 98.7 F (37.1 C), temperature source Oral, resp. rate 18, height '5\' 2"'  (1.575 m), weight 90.7 kg, last menstrual period 10/10/2018, SpO2 99 %. General appearance: alert Lungs: clear to auscultation bilaterally Heart: regular rate and rhythm Abdomen: soft, non-tender; bowel sounds normal Extremities: No calf swelling or tenderness Presentation: cephalic LOA Fetal monitoring: baseline 145, moderate variability, positive accelerations, no decelerations Uterine activity: q4-5 min Cs 2/70/-2  Prenatal labs: ABO, Rh: --/--/AB POS (10/19 1923) Antibody: NEG (10/19 1923) Rubella: 2.59 (03/26 1133) RPR: Non Reactive (08/05 1016)  HBsAg: Negative (03/26 1133)  HIV: Non Reactive (08/05 1016)  GBS: --/[place]10/06 0508)  1 hr Glucola: negative Genetic screening: low risk female, SMA genetic carrier  Anatomy [place]9/28  Left sided pyelectasis (7.57m93motherwise normal. Est. FW 3167  67%   Prenatal Transfer Tool  Maternal Diabetes: No Genetic Screening: Abnormal:  Results: Other: SMA carrier  Maternal Ultrasounds/Referrals: Fetal Kidney Anomalies Fetal Ultrasounds or other Referrals:  Referred to Materal Fetal Medicine  Maternal Substance Abuse:  No Significant Maternal Medications:  Meds include: Other:  ursodiol  Significant Maternal Lab Results: Group B Strep negative  Results for orders placed or performed during the hospital encounter of 07/16/19 (from the past 24 hour(s))  SARS Coronavirus 2 by RT PCR (hospital order, performed [facility]) Nasopharyngeal Nasopharyngeal Swab   Collection Time: 07/16/19  6:57 PM   Specimen: Nasopharyngeal Swab  Result Value Ref Range   SARS Coronavirus 2 NEGATIVE NEGATIVE  POCT fern test   Collection Time: 07/16/19  6:59 PM  Result Value Ref Range    POCT Fern Test Positive = ruptured amniotic membanes   Fern Test   Collection Time: 07/16/19  7:02 PM  Result Value Ref Range   POCT Fern Test Positive = ruptured amniotic membanes   CBC   Collection Time: 07/16/19  7:23 PM  Result Value Ref Range   WBC 7.0 4.0 - 10.5 K/uL   RBC 4.27 3.87 - 5.11 MIL/uL   Hemoglobin 12.8 12.0 -  15.0 g/dL   HCT 38.4 36.0 - 46.0 %   MCV 89.9 80.0 - 100.0 fL   MCH 30.0 26.0 - 34.0 pg   MCHC 33.3 30.0 - 36.0 g/dL   RDW 14.6 11.5 - 15.5 %   Platelets 333 150 - 400 K/uL   nRBC 0.0 0.0 - 0.2 %  Type and screen [place]   Collection Time: 07/16/19  7:23 PM  Result Value Ref Range   ABO/RH(D) AB POS    Antibody Screen NEG    Sample Expiration      07/19/2019,2359 Performed at [facility], [place] [address] [place],  [address]     Patient Active Problem List   Diagnosis Date Noted  . Pregnancy with 39 completed weeks gestation 07/16/2019  . Cholestasis during pregnancy 07/16/2019  . Labor and delivery, indication for care 07/16/2019  . Genetic carrier 05/01/2019  . Pyelectasis of fetus on prenatal ultrasound 02/21/2019  . Supervision of normal first pregnancy, antepartum 12/21/2018    Assessment: Kim Stanton is a 28 y.o. female G1P0 with IUP at 9w6dby LMP EDD 07/17/19 presenting for SROM, latent labor, and elevated blood pressure. Pregnancy complicated by gestational cholestasis, SMA carrier, and pyelectasis of fetus.   #Labor: Management for expectant vaginal delivery in early labor.  -SVE: SROM @ 1400 > 2/70/-2 @ 1812 >  #Pain: IV pain medication > Epidural upon request  #FWB: Cat 1;  Est. FW 3167  67%#Left sided Pylectasis: Visualized on anatomy ultrasound.  #ID: GBS negative  #MOF: Breast  #MOC: POP  #Circ:  NA Girl   #Elevated BP: 146/100 > 110/97. Will follow up on U Pr/Cr and CMP.  #Gestational cholestasis?: Patient treated symptomatically for itching; however, bile acids and last cmp WNL.Will DC  actigall. #SMA Carrier/pyelectasis: Will make Peds aware via PTT   JMaxie Better PGY-1, MD Labor and Delivery Teaching service  07/16/2019, 10:07 PM

## 2019-07-16 NOTE — Progress Notes (Signed)
Patient Vitals for the past 4 hrs:  BP Temp Temp src Pulse Resp SpO2 Height Weight  07/16/19 2232 121/72 - - (!) 112 18 - - -  07/16/19 2200 107/60 - - 89 18 - - -  07/16/19 2131 108/62 98.7 F (37.1 C) Oral 90 18 - 5\' 2"  (1.575 m) 90.7 kg  07/16/19 2101 125/67 - - (!) 107 18 - - -  07/16/19 2056 123/79 - - 90 18 - - -  07/16/19 2051 (!) 102/52 - - (!) 102 18 - - -  07/16/19 2045 111/66 - - (!) 104 18 99 % - -  07/16/19 2040 94/68 - - (!) 110 18 99 % - -  07/16/19 2035 110/63 - - (!) 118 18 99 % - -  07/16/19 2033 137/77 - - (!) 110 18 97 % - -  07/16/19 2025 (!) 130/57 - - 97 18 100 % - -    Comfortable w/epidural.  FHR Cat 1. Ctx q 2-3 minutes.  Cx now 5/100/-2.  Leaking clear fluid.    A/P:  SROM w/onset of labor.  Expectant mgt  A few elevated BPs upon arrival.  PreE labs pending.

## 2019-07-17 ENCOUNTER — Encounter: Payer: Managed Care, Other (non HMO) | Admitting: Obstetrics

## 2019-07-17 ENCOUNTER — Encounter (HOSPITAL_COMMUNITY): Payer: Self-pay

## 2019-07-17 DIAGNOSIS — Z3A39 39 weeks gestation of pregnancy: Secondary | ICD-10-CM

## 2019-07-17 LAB — RPR: RPR Ser Ql: NONREACTIVE

## 2019-07-17 LAB — PROTEIN / CREATININE RATIO, URINE
Creatinine, Urine: 165.53 mg/dL
Protein Creatinine Ratio: 0.14 mg/mg{Cre} (ref 0.00–0.15)
Total Protein, Urine: 24 mg/dL

## 2019-07-17 MED ORDER — OXYCODONE HCL 5 MG PO TABS
5.0000 mg | ORAL_TABLET | ORAL | Status: DC | PRN
  Administered 2019-07-17: 5 mg via ORAL
  Filled 2019-07-17: qty 1

## 2019-07-17 MED ORDER — SODIUM CHLORIDE 0.9 % IV SOLN
2.0000 g | Freq: Four times a day (QID) | INTRAVENOUS | Status: DC
  Administered 2019-07-17 (×2): 2 g via INTRAVENOUS
  Filled 2019-07-17 (×5): qty 2000

## 2019-07-17 MED ORDER — COCONUT OIL OIL
1.0000 "application " | TOPICAL_OIL | Status: DC | PRN
  Administered 2019-07-18: 1 via TOPICAL

## 2019-07-17 MED ORDER — PRENATAL MULTIVITAMIN CH
1.0000 | ORAL_TABLET | Freq: Every day | ORAL | Status: DC
  Administered 2019-07-18 – 2019-07-19 (×2): 1 via ORAL
  Filled 2019-07-17 (×2): qty 1

## 2019-07-17 MED ORDER — DIPHENHYDRAMINE HCL 25 MG PO CAPS: 25.0000 mg | ORAL_CAPSULE | Freq: Four times a day (QID) | ORAL | Status: DC | PRN

## 2019-07-17 MED ORDER — OXYCODONE HCL 5 MG PO TABS: 10.0000 mg | ORAL_TABLET | ORAL | Status: DC | PRN

## 2019-07-17 MED ORDER — WITCH HAZEL-GLYCERIN EX PADS
1.0000 "application " | MEDICATED_PAD | CUTANEOUS | Status: DC | PRN
  Administered 2019-07-17: 1 via TOPICAL

## 2019-07-17 MED ORDER — IBUPROFEN 600 MG PO TABS
600.0000 mg | ORAL_TABLET | Freq: Four times a day (QID) | ORAL | Status: DC
  Administered 2019-07-17 – 2019-07-19 (×8): 600 mg via ORAL
  Filled 2019-07-17 (×8): qty 1

## 2019-07-17 MED ORDER — BENZOCAINE-MENTHOL 20-0.5 % EX AERO
1.0000 "application " | INHALATION_SPRAY | CUTANEOUS | Status: DC | PRN
  Filled 2019-07-17: qty 56

## 2019-07-17 MED ORDER — ONDANSETRON HCL 4 MG/2ML IJ SOLN: 4.0000 mg | INTRAMUSCULAR | Status: DC | PRN

## 2019-07-17 MED ORDER — ZOLPIDEM TARTRATE 5 MG PO TABS: 5.0000 mg | ORAL_TABLET | Freq: Every evening | ORAL | Status: DC | PRN

## 2019-07-17 MED ORDER — TETANUS-DIPHTH-ACELL PERTUSSIS 5-2.5-18.5 LF-MCG/0.5 IM SUSP: 0.5000 mL | Freq: Once | INTRAMUSCULAR | Status: DC

## 2019-07-17 MED ORDER — GENTAMICIN SULFATE 40 MG/ML IJ SOLN
5.0000 mg/kg | INTRAVENOUS | Status: DC
  Administered 2019-07-17: 330 mg via INTRAVENOUS
  Filled 2019-07-17 (×2): qty 8.25

## 2019-07-17 MED ORDER — SIMETHICONE 80 MG PO CHEW: 80.0000 mg | CHEWABLE_TABLET | ORAL | Status: DC | PRN

## 2019-07-17 MED ORDER — DIBUCAINE (PERIANAL) 1 % EX OINT
1.0000 "application " | TOPICAL_OINTMENT | CUTANEOUS | Status: DC | PRN
  Administered 2019-07-17: 1 via RECTAL
  Filled 2019-07-17: qty 28

## 2019-07-17 MED ORDER — SENNOSIDES-DOCUSATE SODIUM 8.6-50 MG PO TABS
2.0000 | ORAL_TABLET | ORAL | Status: DC
  Administered 2019-07-17 – 2019-07-18 (×2): 2 via ORAL
  Filled 2019-07-17 (×2): qty 2

## 2019-07-17 MED ORDER — ONDANSETRON HCL 4 MG PO TABS: 4.0000 mg | ORAL_TABLET | ORAL | Status: DC | PRN

## 2019-07-17 MED ORDER — ACETAMINOPHEN 325 MG PO TABS
650.0000 mg | ORAL_TABLET | ORAL | Status: DC | PRN
  Administered 2019-07-17 – 2019-07-18 (×3): 650 mg via ORAL
  Filled 2019-07-17 (×3): qty 2

## 2019-07-17 NOTE — Progress Notes (Signed)
Vitals:   07/17/19 0220 07/17/19 0231  BP:  123/62  Pulse:  (!) 126  Resp:  18  Temp: 98.7 F (37.1 C) 98.7 F (37.1 C)  SpO2:     FHR baseline180's. Moderate variability w/accels, no decels. Ctx q 2-3 minutes.  Cx 7-8/100/-1,0 station. Skin feels warm.  Suspected triple I given all clinical signs except confirmed fever.  Start amp/gent/Tylenol.

## 2019-07-17 NOTE — Progress Notes (Signed)
Code APGAR.  I arrived in pt's room and noticed FOB was in the corner speaking to Dr Barbaraann Rondo about his baby.  He was asking questions about their hopes to prolong the cutting of the umbilical cord and Dr Barbaraann Rondo explained that they would not be able to do that because the baby needed resuscitation.  Baby is now doing fine and will not be going to NICU for observation.  MOB was still being cared for at the end of the delivery process.  Spiritual Care will follow up once pt has been transferred to her new room.  Please page as further needs arise.  Donald Prose. Elyn Peers, M.Div. [facility] Chaplain Pager [phone] Office [phone]

## 2019-07-17 NOTE — Progress Notes (Signed)
Pharmacy Antibiotic Note  Kim Stanton is a 28 y.o. female admitted on 07/16/2019 with SROM, latent labor, and elevated blood pressures. Patient now with suspected triple I.  Pharmacy has been consulted for gentamicin dosing.  Plan: gentamicin 5mg /kg Q24  Height: 5\' 2"  (157.5 cm) Weight: 199 lb 15.3 oz (90.7 kg) IBW/kg (Calculated) : 50.1  Temp (24hrs), Avg:98.7 F (37.1 C), Min:98.6 F (37 C), Max:98.7 F (37.1 C)  Recent Labs  Lab 07/16/19 1923 07/16/19 1950  WBC 7.0  --   CREATININE  --  0.69    Estimated Creatinine Clearance: 110.6 mL/min (by C-G formula based on SCr of 0.69 mg/dL).    Allergies  Allergen Reactions  . Lactose Intolerance (Gi) Other (See Comments)    Upset stomach    Antimicrobials this admission: Amp 2g Q6  10/20 >>  gentamicin 5mg /kg q24 10/20 >>   Thank you for allowing pharmacy to be a part of this patient's care.  Nyra Capes 07/17/2019 3:04 AM

## 2019-07-17 NOTE — Progress Notes (Signed)
Vitals:   07/17/19 0631 07/17/19 0701  BP: (!) 109/55 113/65  Pulse: (!) 134 (!) 123  Resp: 18 18  Temp:  98.2 F (36.8 C)  SpO2:     FHR 150-160's, avg variability w/accels, no decels.  Ctx q 2 minutes.  Pt still feels warm, output good.  Feeling some mild pressure.  cx ant lip/0 station. Will labor down, continue abx.

## 2019-07-17 NOTE — Lactation Note (Signed)
This note was copied from a baby's chart. Lactation Consultation Note  Patient Name: Kim Stanton [id number] Date: 07/17/2019 Reason for consult: Initial assessment;Primapara;1st time breastfeeding;Term  1931 - 2010 - I visited Kim Stanton to conduct an initial breast feeding consult. She reports that her daughter Kim Stanton had a very difficult delivery; mom pushed 2 hours, and baby had shoulder dystocia. She has not breast fed baby at all at this stage.  I began by getting a history of the patient. She has taken a breast feeding class via [facility]. She has her own personal electric breast pump (Lansinoh), which she brought with her to the hospital. She had breast changes while pregnant.  I demonstrated how to hand express. Colostrum noted. Mom can repeat back well. We provided baby a few expressed drops of colostrum by spoon.  I unwrapped baby Kim Stanton to see if she would be willing to eat. She did not wake up even when unwrapped.   Due to baby's status and mom's desire to breast feed, I recommended that she could initiate pumping to stimulate her breasts. I set up a DEBP and showed mom how to dissemble, clean, and reassemble the pump parts. I helped her initiate pumping and fitted her with a size 27 flange. I observed her pump while conducting basic breast feeding education at the bedside. I shared the [facility] booklet with Kim Stanton and her significant other and covered basic breast feeding education points.  PLAN:  Breast feed 8-12 times a day on demand. Attempt to wake baby to feed as needed. Monitor for feeding cues (discussed). If baby does not latch, I recommended that she pump for 15 minutes and feed back an EBM. I left additional spoons at the bedside, and recommended that she may be able to obtain more milk to supplement baby via hand expression.  Kim Stanton verbalized understanding of this plan.   Maternal Data Formula Feeding for Exclusion: No Has patient been taught  Hand Expression?: Yes Does the patient have breastfeeding experience prior to this delivery?: No   Interventions Interventions: Breast feeding basics reviewed;Hand express;DEBP  Lactation Tools Discussed/Used Tools: Pump;Other (comment)(spoon) Breast pump type: Double-Electric Breast Pump WIC Program: Yes Pump Review: Setup, frequency, and cleaning;Milk Storage Initiated by:: hl Date initiated:: 07/18/19   Consult Status Consult Status: Follow-up Date: 07/18/19 Follow-up type: In-patient    Lenore Manner 07/17/2019, 8:48 PM

## 2019-07-17 NOTE — Discharge Summary (Addendum)
Postpartum Discharge Summary      Patient Name: Kim Stanton DOB: 27-Jun-1991 MRN: [id number]  Date of admission: 07/16/2019 Delivering Provider: Marcille Buffy D   Date of discharge: 07/19/2019  Admitting diagnosis: CTX Intrauterine pregnancy: [redacted]w[redacted]d    Secondary diagnosis:  Principal Problem:   Labor and delivery, indication for care Active Problems:   Supervision of normal first pregnancy, antepartum   Pyelectasis of fetus on prenatal ultrasound   Genetic carrier   Pregnancy with 343completed weeks gestation   Cholestasis during pregnancy   Shoulder dystocia during labor and delivery  Additional problems:      Discharge diagnosis: Term Pregnancy Delivered                                                                                                Post partum procedures: Nexplanon insertion on PPD#1  Augmentation: none  Complications: Shoulder Dystocia, 2nd degree laceration   Hospital course:  Onset of Labor With Vaginal Delivery     28y.o. yo G1P0 at 468w0das admitted in Active Labor on 07/16/2019 with SROM. Patient had an uncomplicated labor course- presented at 2 cm and progressed to complete with augmentation with pitocin (no record of Pitocin aug in nursing notes or delivery summary). Delivery complicated by shoulder dystocia (see delivery note) and 2nd degree perineal laceration which was repaired in usual manner. Some initial BPs were slightly elevated, but pre-e labs were neg and then her pressures normalized.   Membrane Rupture Time/Date: 4:00 PM ,07/16/2019   Intrapartum Procedures: Episiotomy: None [1]                                         Lacerations:  2nd degree [3];Perineal [11]  Patient had a delivery of a Viable infant. 07/17/2019  Information for the patient's newborn:  JoBrinlee, Gambrell0[[id number]]Delivery Method: Vag-Spont     Pateint had an uncomplicated postpartum course. She had a Nexplanon placed on PPD#1 per her request.  She is  ambulating, tolerating a regular diet, passing flatus, and urinating well. Patient is discharged home in stable condition on 07/19/19.  Delivery time: 12:06 PM    Magnesium Sulfate received: No BMZ received: No Rhophylac:N/A MMR:N/A Transfusion:No  Physical exam  Vitals:   07/18/19 0801 07/18/19 1504 07/18/19 2147 07/19/19 0539  BP: 116/75 106/67 114/63 113/73  Pulse: (!) 112 91 99 98  Resp: _0 Temp: 98 F (36.7 C) 98.6 F (37 C) 97.9 F (36.6 C) 98.2 F (36.8 C)  TempSrc: Oral Oral Oral Oral  SpO2: 100% 98% 100% 100%  Weight:      Height:       General: alert Lochia: appropriate Uterine Fundus: firm Incision: N/A DVT Evaluation: No evidence of DVT seen on physical exam. Labs: Lab Results  Component Value Date   WBC 7.0 07/16/2019   HGB 12.8 07/16/2019   HCT 38.4 07/16/2019   MCV 89.9 07/16/2019   PLT 333 07/16/2019   CMP  Latest Ref Rng & Units 07/16/2019  Glucose 70 - 99 mg/dL 91  BUN 6 - 20 mg/dL 14  Creatinine 0.44 - 1.00 mg/dL 0.69  Sodium 135 - 145 mmol/L 134(L)  Potassium 3.5 - 5.1 mmol/L 4.0  Chloride 98 - 111 mmol/L 104  CO2 22 - 32 mmol/L 19(L)  Calcium 8.9 - 10.3 mg/dL 9.4  Total Protein 6.5 - 8.1 g/dL 6.5  Total Bilirubin 0.3 - 1.2 mg/dL 0.4  Alkaline Phos 38 - 126 U/L 196(H)  AST 15 - 41 U/L 25  ALT 0 - 44 U/L 13    Discharge instruction: per After Visit Summary and "Baby and Me Booklet".  After visit meds:  Allergies as of 07/19/2019      Reactions   Lactose Intolerance (gi) Nausea Only, Other (See Comments)   Upsets the stomach      Medication List    STOP taking these medications   Blood Pressure Monitor Kit   predniSONE 10 MG (21) Tbpk tablet Commonly known as: STERAPRED UNI-PAK 21 TAB   terconazole 0.8 % vaginal cream Commonly known as: Terazol 3   ursodiol 300 MG capsule Commonly known as: Actigall     TAKE these medications   ibuprofen 600 MG tablet Commonly known as: ADVIL Take 1 tablet (600 mg total) by  mouth every 6 (six) hours as needed.   PRENATAL VITAMINS PLUS PO Take by mouth.       Diet: routine diet  Activity: Advance as tolerated. Pelvic rest for 6 weeks.   Outpatient follow up:4 weeks Follow up Appt: Future Appointments  Date Time Provider [place]  07/22/2019  8:10 AM [facility] None  07/27/2019 11:00 AM [place] None  08/16/2019 10:00 AM Sloan Leiter, MD [facility] None   Follow up Visit:  Please schedule this patient for Postpartum visit in: 4 weeks with the following provider: Any provider For C/S patients schedule nurse incision check in weeks 2 weeks: no Low risk pregnancy complicated by: none Delivery mode:  SVD Anticipated Birth Control:  POPs PP Procedures needed: none  Schedule Integrated BH visit: no  Newborn Data: Live born female  Birth Weight:  3910 g APGAR: 3, 7  Newborn Delivery   Birth date/time: 07/17/2019 12:06:00 Delivery type: Vaginal, Spontaneous      Baby Feeding: Bottle and Breast Disposition:home with mother   07/19/2019 Alexis Frock, MD   CNM attestation I have seen and examined this patient and agree with above documentation in the resident's note.   Kim Stanton is a 28 y.o. G1P1001 s/p vag del.   Pain is well controlled.  Plan for birth control is Nexplanon- placed on PPD#1.  Method of Feeding: breast  PE:  BP 113/73 (BP Location: Right Arm)   Pulse 98   Temp 98.2 F (36.8 C) (Oral)   Resp 18   Ht _0  (1.575 m)   Wt 90.7 kg   LMP 10/10/2018 (Exact Date)   SpO2 100%   Breastfeeding Unknown   BMI 36.57 kg/m  Fundus firm  No results for input(s): HGB, HCT in the last 72 hours.   Plan:  - discharge - postpartum care discussed - f/u clinic in 4-6 weeks for postpartum visit   Myrtis Ser, CNM 12:54 AM

## 2019-07-17 NOTE — Lactation Note (Signed)
This note was copied from a baby's chart. Lactation Consultation Note  Patient Name: Kim Stanton [id number] Date: 07/17/2019 Reason for consult: Mother's request;Follow-up assessment;Primapara;1st time breastfeeding;Term  2145 - 2200 - I followed up with Ms. [place] upon request. I assisted with latching baby [name] in football hold on the right breast. Sandwich technique and expressed breast milk assisted in latching. Baby latched after a few attempts and had rhythmic suckling sequences. I stayed at the bedside for about 10 minutes not the feed to observe baby and teach. Baby was still breast feeding at exit.   I reviewed plan from the earlier visit today. Mom to call for help as needed.  Maternal Data Formula Feeding for Exclusion: No Has patient been taught Hand Expression?: Yes Does the patient have breastfeeding experience prior to this delivery?: No  Feeding Feeding Type: Breast Fed  LATCH Score Latch: Grasps breast easily, tongue down, lips flanged, rhythmical sucking.  Audible Swallowing: A few with stimulation  Type of Nipple: Everted at rest and after stimulation  Comfort (Breast/Nipple): Soft / non-tender  Hold (Positioning): Assistance needed to correctly position infant at breast and maintain latch.  LATCH Score: 8  Interventions Interventions: Breast feeding basics reviewed;Assisted with latch;Skin to skin;Hand express;Adjust position;Support pillows;Position options  Lactation Tools Discussed/Used Tools: Pump;Other (comment)(spoon) Breast pump type: Double-Electric Breast Pump WIC Program: Yes Pump Review: Setup, frequency, and cleaning;Milk Storage Initiated by:: hl Date initiated:: 07/18/19   Consult Status Consult Status: Follow-up Date: 07/18/19 Follow-up type: In-patient    Lenore Manner 07/17/2019, 10:16 PM

## 2019-07-17 NOTE — Progress Notes (Signed)
Kim Stanton is a 28 y.o. G1P0 at [redacted]w[redacted]d  admitted for active labor, rupture of membranes  Subjective:  Starting to feel some back pain with contractions. Not feeling a lot of pressure yet.   Objective: BP 122/76 (BP Location: Left Arm)   Pulse (!) 114   Temp 98.2 F (36.8 C) (Axillary)   Resp 18   Ht 5\' 2"  (1.575 m)   Wt 90.7 kg   LMP 10/10/2018 (Exact Date)   SpO2 99%   BMI 36.57 kg/m  No intake/output data recorded. Total I/O In: -  Out: 425 [Urine:425]  FHT:  FHR: 155 bpm, variability: moderate,  accelerations:  Present,  decelerations:  Absent UC:   regular, every 2 minutes SVE:   Dilation: 10 Effacement (%): 100 Station: 0 Exam by:: Sharyn Lull, RN   Labs: Lab Results  Component Value Date   WBC 7.0 07/16/2019   HGB 12.8 07/16/2019   HCT 38.4 07/16/2019   MCV 89.9 07/16/2019   PLT 333 07/16/2019    Assessment / Plan: Complete, but not feeling much urge to push yet. Will try pushing in about 30 mins   Labor: Progressing normally Preeclampsia:  NA Fetal Wellbeing:  Category I Pain Control:  Epidural I/D:  n/a Anticipated MOD:  NSVD  Marcille Buffy DNP, CNM  07/17/19  9:26 AM

## 2019-07-18 DIAGNOSIS — Z30017 Encounter for initial prescription of implantable subdermal contraceptive: Secondary | ICD-10-CM

## 2019-07-18 MED ORDER — LIDOCAINE HCL 1 % IJ SOLN
0.0000 mL | Freq: Once | INTRAMUSCULAR | Status: AC | PRN
  Administered 2019-07-18: 20 mL via INTRADERMAL
  Filled 2019-07-18: qty 20

## 2019-07-18 MED ORDER — ETONOGESTREL 68 MG ~~LOC~~ IMPL
68.0000 mg | DRUG_IMPLANT | Freq: Once | SUBCUTANEOUS | Status: AC
  Administered 2019-07-18: 68 mg via SUBCUTANEOUS
  Filled 2019-07-18: qty 1

## 2019-07-18 NOTE — Lactation Note (Signed)
This note was copied from a baby's chart. Lactation Consultation Note  Patient Name: Kim Stanton [id number] Date: 07/18/2019 Reason for consult: Follow-up assessment   RN called LC for observing a latch.  Family requested LC.  Infant swaddled in a fleece blanket, fully clothed.  Infant fed last 5 hours ago per mom.    LC reviewed BF basics, +8-12 times in 24 hours.  Waking techniques and STS reviewed.  Mom c/o infant unable to latch on left side.  Infant tongue thrusting when sucking gloved finger.  Did finally sustain latch after multiple attempts.  Mom fed in football hold.  Infant had rhythmic suck pattern and a couple of swallows were heard with massage and compression.  Strongly encouraged mom to do more STS and feed infant more often with hand expressing prior to and after feed.  Mom demonstrated hand exp.   Mom did a good job at bringing infant to breast to feed.  LC encouraged mom to wait until infant had wide gape before allowing her to latch.  Dad very attendant and LC answered all questions.    Maternal Data Has patient been taught Hand Expression?: Yes  Feeding Feeding Type: Breast Fed  LATCH Score Latch: Repeated attempts needed to sustain latch, nipple held in mouth throughout feeding, stimulation needed to elicit sucking reflex.  Audible Swallowing: A few with stimulation  Type of Nipple: Everted at rest and after stimulation(shorter shaft)  Comfort (Breast/Nipple): Soft / non-tender  Hold (Positioning): Assistance needed to correctly position infant at breast and maintain latch.  LATCH Score: 7  Interventions Interventions: Breast feeding basics reviewed;Skin to skin;Assisted with latch;Breast massage;Hand express;Position options;Support pillows;Adjust position  Lactation Tools Discussed/Used     Consult Status Consult Status: Follow-up Date: 07/19/19 Follow-up type: In-patient    Ferne Coe [facility] 07/18/2019, 11:06 PM

## 2019-07-18 NOTE — Lactation Note (Signed)
This note was copied from a baby's chart. Lactation Consultation Note  Patient Name: Kim Stanton [id number] Date: 07/18/2019 Reason for consult: Follow-up assessment;Term;Primapara;1st time breastfeeding  Difficult delivery, shoulder dystocia, bruising and cephalohematoma, increased risk for jaundice.  LC in to visit with P51 Mom of term baby at 85 hrs old.  Baby at 4% weight loss.  Mom holding baby STS on her chest.  Mom states baby just finished breastfeeding on the right breast.  Still more challenging to latch baby on the left breast.  Offered to assist with positioning and latching on left breast, but Mom stated politely that she would rather get help another time.  Baby had just had blood work and became fussy, then fed and is now settled down.   Mom is hand expressing and spoon feeding on occasion.  Mom states her colostrum is "spraying across the room".  Mom knows to hand express prior to latching.  Mom states she is feeling her uterus contract during the feedings.  Encouraged hand expressing from left breast if baby unable to latch from that side.  DEBP set up at bedside.  Disassembled pump parts, washed, rinsing and set to air dry in separate bin.  Mom encouraged to post breastfeed pump to support a full milk supply.  Mom states she has pumped twice so far.  Mom about to eat her dinner.  Encouraged continued STS as much as possible.  Mom knows to call for assistance prn.    Interventions Interventions: Breast feeding basics reviewed;Skin to skin;Breast massage;Hand express;DEBP  Lactation Tools Discussed/Used Tools: Pump Breast pump type: Double-Electric Breast Pump   Consult Status Consult Status: Follow-up Date: 07/19/19 Follow-up type: In-patient    Broadus John 07/18/2019, 5:59 PM

## 2019-07-18 NOTE — Progress Notes (Signed)
POSTPARTUM PROGRESS NOTE  Subjective: Kim Stanton is a 28 y.o. G1P1001 s/p NSVD at [redacted]w[redacted]d, complicated by 4 minute shoulder dystocia.  She reports she doing well. No acute events overnight. She denies any problems with ambulating, voiding or po intake. Denies nausea or vomiting. She has passed flatus. Pain is well controlled.  Lochia is appropriate.  Objective: Blood pressure 106/67, pulse 91, temperature 98.6 F (37 C), temperature source Oral, resp. rate 18, height 5\' 2"  (1.575 m), weight 90.7 kg, last menstrual period 10/10/2018, SpO2 98 %, unknown if currently breastfeeding.  Physical Exam:  General: alert, cooperative and no distress Chest: no respiratory distress Abdomen: soft, non-tender  Uterine Fundus: firm, appropriately tender Extremities: No calf swelling or tenderness  No edema  Recent Labs    07/16/19 1923  HGB 12.8  HCT 38.4    Assessment/Plan: Kim Stanton is a 28 y.o. [id number] s/p NSVD, complicated by 4 minute shoulder dystocia, at [redacted]w[redacted]d for SOL.  Routine Postpartum Care: Doing well, pain well-controlled.  -- Continue routine care, lactation support  -- Contraception: Nexplanon, placed today -- Feeding: Breast  Dispo: Plan for discharge tomorrow.  Merilyn Baba, DO OB/GYN Fellow, [facility] for [facility]

## 2019-07-18 NOTE — Anesthesia Postprocedure Evaluation (Signed)
Anesthesia Post Note  Patient: Kim Stanton  Procedure(s) Performed: AN AD [place]     Patient location during evaluation: Mother Baby Anesthesia Type: Epidural Level of consciousness: awake, awake and alert and oriented Pain management: pain level controlled Vital Signs Assessment: post-procedure vital signs reviewed and stable Respiratory status: spontaneous breathing, nonlabored ventilation and respiratory function stable Cardiovascular status: stable Postop Assessment: no headache, no backache, patient able to bend at knees, no apparent nausea or vomiting, adequate PO intake and able to ambulate Anesthetic complications: no    Last Vitals:  Vitals:   07/18/19 0300 07/18/19 0801  BP: 100/60 116/75  Pulse: (!) 104 (!) 112  Resp: 16 18  Temp: 36.7 C 36.7 C  SpO2: 100% 100%    Last Pain:  Vitals:   07/18/19 0803  TempSrc:   PainSc: 4    Pain Goal:                   Lanisha Stepanian

## 2019-07-18 NOTE — Progress Notes (Signed)
CSW notified by RN that MOB is requesting to speak with  CSW regarding Medicaid. CSW spoke with MOB at bedside. CSW was advised per MOB that she is looking to get Medicaid for infant. CSW advised MOB that CSW would follow up with Financial Counseling to see what they can offer MOB. CSW spoke with Nita from [facility] and was advised that she would call into MOB's room to speak with her regarding further Medicaid needs.     Xavious Sharrar S. Larnell Granlund, MSW, LCSW [facility] [place] [phone]   

## 2019-07-18 NOTE — Procedures (Signed)
Post-Placental Nexplanon Insertion Procedure Note  Patient was identified. Informed consent was signed, signed copy in chart. A time-out was performed.    The insertion site was identified 8-10 cm (3-4 inches) from the medial epicondyle of the humerus and 3-5 cm (1.25-2 inches) posterior to (below) the sulcus (groove) between the biceps and triceps muscles of the patient's left arm and marked. The site was prepped and draped in the usual sterile fashion. Pt was prepped with alcohol swab and then injected with 3 cc of 1% lidocaine. The site was prepped with betadine. Nexplanon removed form packaging,  Device confirmed in needle, then inserted full length of needle and withdrawn per handbook instructions. Provider and patient verified presence of the implant in the woman's arm by palpation. Pt insertion site was covered with adhesive bandage and pressure bandage. There was minimal blood loss. Patient tolerated procedure well.  Patient was given post procedure instructions and Nexplanon user card with expiration date. Condoms were recommended for STI prevention. Patient was asked to keep the pressure dressing on for 24 hours to minimize bruising and keep the adhesive bandage on for 3-5 days. The patient verbalized understanding of the plan of care and agrees.   Lot # [id number] Expiration Date11/28/2022

## 2019-07-19 MED ORDER — IBUPROFEN 600 MG PO TABS: 600.0000 mg | ORAL_TABLET | Freq: Four times a day (QID) | ORAL | 0 refills | Status: DC | PRN

## 2019-07-19 NOTE — Lactation Note (Signed)
This note was copied from a baby's chart. Lactation Consultation Note  Patient Name: Girl Shalise Rosado [id number] Date: 07/19/2019  Baby is 46 hours old/7% weight loss.  Mom states she feels good about feedings.  Observed mom easily latch baby to both breasts using football hold.  Good depth achieved.  Discussed milk coming to volume and the prevention and treatment of engorgement.  Mom had some questions which were answered.  She does have a breast pump at home.  Reviewed lactation outpatient services and encouraged to call prn.   Maternal Data    Feeding Feeding Type: Breast Fed  LATCH Score Latch: Grasps breast easily, tongue down, lips flanged, rhythmical sucking.  Audible Swallowing: Spontaneous and intermittent  Type of Nipple: Everted at rest and after stimulation  Comfort (Breast/Nipple): Soft / non-tender  Hold (Positioning): No assistance needed to correctly position infant at breast.  LATCH Score: 10  Interventions    Lactation Tools Discussed/Used     Consult Status Consult Status: Complete Follow-up type: Call as needed    Ave Filter 07/19/2019, 10:40 AM

## 2019-07-19 NOTE — Discharge Instructions (Signed)

## 2019-07-22 ENCOUNTER — Other Ambulatory Visit (HOSPITAL_COMMUNITY)
Admission: RE | Admit: 2019-07-22 | Discharge: 2019-07-22 | Disposition: A | Discharge status: Home / Self Care | Payer: Managed Care, Other (non HMO) | Source: Ambulatory Visit | Attending: Family Medicine | Admitting: Family Medicine

## 2019-07-24 ENCOUNTER — Inpatient Hospital Stay (HOSPITAL_COMMUNITY)
Admission: AD | Admit: 2019-07-24 | Payer: Managed Care, Other (non HMO) | Source: Home / Self Care | Admitting: Obstetrics and Gynecology

## 2019-07-24 ENCOUNTER — Inpatient Hospital Stay (HOSPITAL_COMMUNITY): Payer: Managed Care, Other (non HMO)

## 2019-07-27 ENCOUNTER — Ambulatory Visit: Payer: Managed Care, Other (non HMO)

## 2019-07-31 ENCOUNTER — Telehealth: Payer: Self-pay

## 2019-07-31 NOTE — Telephone Encounter (Signed)
returned call to pt

## 2019-08-16 ENCOUNTER — Ambulatory Visit: Payer: Managed Care, Other (non HMO) | Admitting: Obstetrics and Gynecology

## 2019-08-22 ENCOUNTER — Other Ambulatory Visit: Payer: Self-pay

## 2019-08-22 ENCOUNTER — Ambulatory Visit (INDEPENDENT_AMBULATORY_CARE_PROVIDER_SITE_OTHER): Payer: Managed Care, Other (non HMO)

## 2019-08-22 DIAGNOSIS — Z1389 Encounter for screening for other disorder: Secondary | ICD-10-CM | POA: Diagnosis not present

## 2019-08-22 DIAGNOSIS — Z3046 Encounter for surveillance of implantable subdermal contraceptive: Secondary | ICD-10-CM

## 2019-08-22 NOTE — Progress Notes (Signed)
Post Partum Exam  Kim Stanton is a 27 y.o. G72P1001 female who presents for a postpartum visit. She is 4 weeks postpartum following a spontaneous vaginal delivery. I have fully reviewed the prenatal and intrapartum course. The delivery was at 40 gestational weeks.  Anesthesia: epidural. Postpartum course has been uncomplicated. 66 course has been "really good." Baby is feeding by breast and reports "its going really good."Bleeding no bleeding, but reports some discharge and occasional spotting. She reports the discharge has a smell that has been present since after delivery.  She reports that it is "metalically" smell.  She also reports some irritation, soreness, and increased perception of her stitches.  Bowel function is normal. Bladder function is normal. Patient is not sexually active. Contraception method is Nexplanon. Postpartum depression screening:neg (score: 0)  The following portions of the patient's history were reviewed and updated as appropriate: allergies, current medications, past family history, past medical history, past social history, past surgical history and problem list. Last pap smear done 12/21/2018 and was Normal  Review of Systems Pertinent items are noted in HPI.    Objective:  Last menstrual period 10/10/2018, unknown if currently breastfeeding.  General:  alert, cooperative and no distress   Breasts:  Not Examined  Lungs: clear to auscultation bilaterally  Heart:  regular rate and rhythm  Abdomen: normal findings: bowel sounds normal, no masses palpable and soft, non-tender   Vulva:  normal  Vagina: Normal Vagina.  Sutures intact.  Granulation present.  Area at introitus with some tissue exposure and bleeding; silver nitrate applied with good cauterization. Suture knots cut and removed x2 w/o difficulty.  Cervix:  Not Assessed  Corpus: not examined  Adnexa:  not evaluated  Rectal Exam: Not performed.        Assessment:   28 year old Postpartum  State Normal Involution 2nd Degree Perineal Laceration Pap smear UTD Breastfeeding   Plan:   Contraception: Nexplanon Discussed healing stage of laceration.  Encouraged continued pelvic rest x 2 weeks. Reviewed usage of lubrication to promote comfort during sexual activity while breastfeeding Okay to return to other activities including work and exercise. Encouraged to call if any other questions or concerns arise. 3. Follow up in: 1 year for annual exam or as needed.    Maryann Conners MSN, CNM Advanced Practice Provider, Center for [facility]

## 2019-08-22 NOTE — Patient Instructions (Signed)

## 2019-09-03 ENCOUNTER — Ambulatory Visit: Payer: Managed Care, Other (non HMO) | Admitting: Obstetrics and Gynecology

## 2019-10-10 ENCOUNTER — Other Ambulatory Visit: Payer: Self-pay | Admitting: Advanced Practice Midwife

## 2019-11-18 ENCOUNTER — Inpatient Hospital Stay (HOSPITAL_COMMUNITY)
Admission: AD | Admit: 2019-11-18 | Discharge: 2019-11-18 | Disposition: A | Discharge status: Home / Self Care | Payer: Managed Care, Other (non HMO) | Source: Ambulatory Visit | Attending: Family Medicine | Admitting: Family Medicine

## 2019-11-18 ENCOUNTER — Other Ambulatory Visit: Payer: Self-pay

## 2019-11-18 ENCOUNTER — Encounter (HOSPITAL_COMMUNITY): Payer: Self-pay | Admitting: Family Medicine

## 2019-11-18 DIAGNOSIS — N61 Mastitis without abscess: Secondary | ICD-10-CM | POA: Insufficient documentation

## 2019-11-18 DIAGNOSIS — Z87891 Personal history of nicotine dependence: Secondary | ICD-10-CM | POA: Diagnosis not present

## 2019-11-18 DIAGNOSIS — N644 Mastodynia: Secondary | ICD-10-CM | POA: Diagnosis present

## 2019-11-18 MED ORDER — IBUPROFEN 600 MG PO TABS: 600.0000 mg | ORAL_TABLET | Freq: Four times a day (QID) | ORAL | 0 refills | Status: DC | PRN

## 2019-11-18 MED ORDER — CEPHALEXIN 500 MG PO CAPS: 500.0000 mg | ORAL_CAPSULE | Freq: Four times a day (QID) | ORAL | 0 refills | Status: DC

## 2019-11-18 NOTE — MAU Note (Signed)
Kim Stanton is a 29 y.o.  here in MAU reporting: Left breast pain Has tried to pump and have her baby feed. Tried hand expressing and warm compresses.  Onset of complaint: started yesterday Pain score: 10/10 Patient states she has the nexplanon. States she is not pregnant.  Vitals:   11/18/19 1633  BP: 122/67  Pulse: (!) 105  Resp: 18  Temp: 99.5 F (37.5 C)  SpO2: 98%     Lab orders placed from triage: none

## 2019-11-18 NOTE — MAU Provider Note (Signed)
Chief Complaint: Breast Pain   First Provider Initiated Contact with Patient 11/18/19 1650     SUBJECTIVE HPI: Kim Stanton is a 29 y.o. G1P1001 at 4 months postpartum who presents to Maternity Admissions reporting breast pain. Symptoms started on Friday. Reports that last week the lactation person from the [facility] office told her to breast feed more on her left breast  (she had primarily been feeding on the right breast). Patient did that for a short period of time but states it was painful so she returned to just her right breast. She then developed pain in her left breast that has worsened over the weekend. Rates pain 10/10. Describes as throbbing. Pain radiates to her axilla and left sided back. Pain is worse with movement & touch. Has not treated symptoms. Has had chills but no fever. No breast mass or change in nipple discharge.    Past Medical History:  Diagnosis Date  . Encounter for supervision of normal pregnancy, antepartum 12/21/2018    Nursing Staff Provider Office Location  [facility] Dating   LMP 10/10/2018 Language   English Anatomy US   11/25/2018 Flu Vaccine   Declined 12/21/2018 Genetic Screen  NIPS:   AFP:   First Screen:  Quad:   TDaP vaccine    Hgb A1C or  GTT Early  Third trimester  Rhogam     LAB RESULTS  Feeding Plan  Breast Blood Type --/--/AB POS Performed at [facility], [address] [place], [place] [address]  . 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 Medical history non-contributory    OB History  Gravida Para Term Preterm AB Living  1 1 1     1   SAB TAB Ectopic Multiple Live Births        0 1    # Outcome Date GA Lbr Len/2nd Weight Sex Delivery Anes PTL Lv  1 Term 07/17/19 [redacted]w[redacted]d 16:19 / 03:47 3910 g F Vag-Spont EPI  LIV   Past Surgical History:  Procedure Laterality Date  . NO PAST SURGERIES    . WISDOM TOOTH EXTRACTION     Social History   Socioeconomic History  . Marital status: Single    Spouse name: Not on file   . Number of children: Not on file  . Years of education: Not on file  . Highest education level: Not on file  Occupational History  . Not on file  Tobacco Use  . Smoking status: Former Smoker    Types: Cigarettes    Quit date: 11/09/2018    Years since quitting: 1.0  . Smokeless tobacco: Never Used  Substance and Sexual Activity  . Alcohol use: Not Currently    Alcohol/week: 2.0 standard drinks    Types: 2 Shots of liquor per week    Comment: often WEEKENDS (05/30/18)  . Drug use: Never  . Sexual activity: Yes    Partners: Male  Other Topics Concern  . Not on file  Social History Narrative  . Not on file   Social Determinants of Health   Financial Resource Strain:   . Difficulty of Paying Living Expenses: Not on file  Food Insecurity:   . Worried About Charity fundraiser in the Last Year: Not on file  . Ran Out of Food in the Last Year: Not on file  Transportation Needs:   . Lack of Transportation (Medical): Not on file  . Lack of Transportation (Non-Medical): Not on file  Physical Activity:   . Days of Exercise per Week: Not on file  . Minutes of Exercise per Session: Not on file  Stress:   . Feeling of Stress : Not on file  Social Connections:   . Frequency of Communication with Friends and Family: Not on file  . Frequency of Social Gatherings with Friends and Family: Not on file  . Attends Religious Services: Not on file  . Active Member of Clubs or Organizations: Not on file  . Attends Banker Meetings: Not on file  . Marital Status: Not on file  Intimate Partner Violence:   . Fear of Current or Ex-Partner: Not on file  . Emotionally Abused: Not on file  . Physically Abused: Not on file  . Sexually Abused: Not on file   Family History  Problem Relation Age of Onset  . Cancer Maternal Grandmother        Breast  . Diabetes Maternal Grandmother    No current facility-administered medications on file prior to encounter.   Current Outpatient  Medications on File Prior to Encounter  Medication Sig Dispense Refill  . Prenatal Vit-Fe Fumarate-FA (PRENATAL VITAMINS PLUS PO) Take by mouth.     Allergies  Allergen Reactions  . Lactose Intolerance (Gi) Nausea Only and Other (See Comments)    Upsets the stomach    I have reviewed patient's Past Medical Hx, Surgical Hx, Family Hx, Social Hx, medications and allergies.   Review of Systems  Constitutional: Positive for chills. Negative for fever.  HENT: Negative.   Genitourinary:       + breast pain  Skin: Negative.     OBJECTIVE Patient Vitals for the past 24 hrs:  BP Temp Temp src Pulse Resp SpO2  11/18/19 1728 - - - - 18 -  11/18/19 1633 122/67 99.5 F (37.5 C) Oral (!) 105 18 98 %   Constitutional: Well-developed, well-nourished female in no acute distress.  Cardiovascular: normal rate & rhythm, no murmur Respiratory: normal rate and effort. Lung sounds clear throughout GI: Abd soft, non-tender, Pos BS x 4. No guarding or rebound tenderness MS: Extremities nontender, no edema, normal ROM Neurologic: Alert and oriented x 4.  GU: left breast tender TTP. No nipple discharge. Elongated firmness at 3 o clock of left breast. No other masses. No axillary lymphadenopathy    LAB RESULTS No results found for this or any previous visit (from the past 24 hour(s)).  IMAGING No results found.  MAU COURSE Orders Placed This Encounter  Procedures  . Discharge patient   Meds ordered this encounter  Medications  . cephALEXin (KEFLEX) 500 MG capsule    Sig: Take 1 capsule (500 mg total) by mouth 4 (four) times daily.    Dispense:  28 capsule    Refill:  0    Order Specific Question:   Supervising Provider    Answer:   Reva Bores [[id number]]  . ibuprofen (ADVIL) 600 MG tablet    Sig: Take 1 tablet (600 mg total) by mouth every 6 (six) hours as needed.    Dispense:  30 tablet    Refill:  0    Order Specific Question:   Supervising Provider    Answer:   Samara Snide     MDM Breastfeeding female at 4 months postpartum. Borderline fever. Breast exam consistent with mastitis likely due to recent changes related to breastfeeding. Mass on left breast c/w plugged duct. Will start on antibiotics. No evidence of breast abscess  at this time. Pt to follow up with [facility] if symptoms worsen or don't improve in 24-48 hrs.   ASSESSMENT 1. Mastitis, left, acute     PLAN Discharge home in stable condition. Discussed reasons to return to MAU Rx keflex & ibuprofen Heat, ice, expression F/u with [facility] if symptoms worsen or don't improve in 24-48 hrs. Pt given contact information for hospital lactation team  Follow-up Information    [facility] [place] [facility] Follow up.   Specialty: Lactation Contact information: [address] [place] [address], [address] [id number] mc [place] [address] [phone]         Allergies as of 11/18/2019      Reactions   Lactose Intolerance (gi) Nausea Only, Other (See Comments)   Upsets the stomach      Medication List    TAKE these medications   cephALEXin 500 MG capsule Commonly known as: KEFLEX Take 1 capsule (500 mg total) by mouth 4 (four) times daily.   ibuprofen 600 MG tablet Commonly known as: ADVIL Take 1 tablet (600 mg total) by mouth every 6 (six) hours as needed.   PRENATAL VITAMINS PLUS PO Take by mouth.        Judeth Horn, NP 11/18/2019  6:45 PM

## 2019-11-18 NOTE — Discharge Instructions (Signed)
Mastitis  Mastitis is inflammation of the breast tissue. It occurs most often in women who are breastfeeding, but it can also affect other women, and sometimes even men. What are the causes? This condition is usually caused by a bacterial infection. Bacteria enter the breast tissue through cuts or openings in the skin. Typically, this occurs with breastfeeding because of cracked or irritated nipples. Sometimes, it can occur when there is no opening in the skin. This is usually caused by plugged milk ducts. Other causes include:  Nipple piercing.  Some forms of breast cancer. What are the signs or symptoms? Symptoms of this condition include:  Swelling, redness, tenderness, and pain in an area of the breast. The area may also feel warm to the touch. These symptoms usually affect the upper part of the breast, toward the armpit region.  Swelling of the glands under the arm on the same side.  Fever.  Rapid pulse.  Fatigue, headache, and flu-like muscle aches. If an infection is allowed to progress, a collection of pus (abscess) may develop. How is this diagnosed? This condition can usually be diagnosed based on a physical exam and your symptoms. You may also have other tests, such as:  Blood tests to determine if your body is fighting a bacterial infection.  Mammogram or ultrasound tests to rule out other problems or diseases.  Testing of pus and other fluids. Pus from the breast may be collected and examined in the lab. If an abscess has developed, the fluid in the abscess can be removed with a needle. This test can be used to confirm the diagnosis and identify the bacteria present.  If you are breastfeeding, breast milk may be cultured and tested for bacteria. How is this treated? Treatment for this condition may include:  Applying heat or cold compresses to the affected area.  Medicine for pain.  Antibiotic medicine to treat a bacterial infection. This is usually taken by  mouth.  Self-care such as rest and increased fluid intake.  If an abscess has developed, it may be treated by removing fluid with a needle. Mastitis that occurs with breastfeeding will sometimes go away on its own, so your health care provider may choose to wait 24 hours after first seeing you to decide whether a prescription medicine is needed. You may be told of different ways to help manage breastfeeding, such as continuing to breastfeed or pump in order to ensure adequate milk flow. Follow these instructions at home: Medicines  Take over-the-counter and prescription medicines only as told by your health care provider.  If you were prescribed an antibiotic medicine, take it as told by your health care provider. Do not stop taking the antibiotic even if you start to feel better. General instructions  Do not wear a tight or underwire bra. Wear a soft, supportive bra.  Increase your fluid intake, especially if you have a fever.  Get plenty of rest. If you are breastfeeding:  Continue to empty your breasts as often as possible either by breastfeeding or by using a breast pump. This will decrease the pressure and the pain that comes with it. Ask your health care provider if changes need to be made to your breastfeeding or pumping routine.  Keep your nipples clean and dry.  During breastfeeding, empty the first breast completely before going to the other breast. If your baby is not emptying your breasts completely, use a breast pump to empty your breasts.  Use breast massage during feeding or pumping sessions.    If directed, apply moist heat to the affected area of your breast right before breastfeeding or pumping. Use the heat source that your health care provider recommends.  If directed, put ice on the affected area of your breast right after breastfeeding or pumping: ? Put ice in a plastic bag. ? Place a towel between your skin and the bag. ? Leave the ice on for 20 minutes.  If  you go back to work, pump your breasts while at work to stay within your nursing schedule.  Avoid allowing your breasts to become overly filled with milk (engorged). Contact a health care provider if:  You have pus-like discharge from the breast.  You have a fever.  Your symptoms do not improve within 2 days of starting treatment. Get help right away if:  Your pain and swelling are getting worse.  You have pain that is not controlled with medicine.  You have a red line extending from the breast toward your armpit. Summary  Mastitis is inflammation of the breast tissue. It occurs most often in women who are breastfeeding, but it can also affect non-breastfeeding women and some men.  This condition is usually caused by a bacterial infection.  This condition may be treated with hot and cold compresses, medicines, self-care, and certain breastfeeding strategies.  If you were prescribed an antibiotic medicine, take it as told by your health care provider. Do not stop taking the antibiotic even if you start to feel better. This information is not intended to replace advice given to you by your health care provider. Make sure you discuss any questions you have with your health care provider. Document Revised: 08/26/2017 Document Reviewed: 10/05/2016 Elsevier Patient Education  2020 [employer]  

## 2020-02-12 ENCOUNTER — Telehealth: Payer: Self-pay

## 2020-02-12 NOTE — Telephone Encounter (Signed)
Returned call, pt requests note that date maternity leave ended on 10-18-19, advised pt that note is at front desk for pick up.

## 2020-03-06 ENCOUNTER — Other Ambulatory Visit (INDEPENDENT_AMBULATORY_CARE_PROVIDER_SITE_OTHER): Payer: Self-pay | Admitting: Orthopaedic Surgery

## 2020-05-30 ENCOUNTER — Other Ambulatory Visit (INDEPENDENT_AMBULATORY_CARE_PROVIDER_SITE_OTHER): Payer: Self-pay | Admitting: Orthopaedic Surgery

## 2020-06-06 IMAGING — US US MFM OB FOLLOW UP
1 series · 13 of 28 positions shown · non-contrast
Comparison: none

[Series 1: us mfm ob follow up · 13 of 60 slices shown]
[im 3/60]
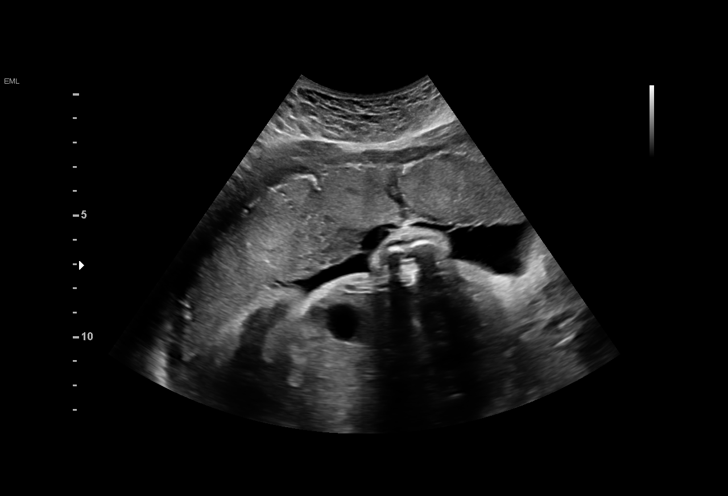
[im 7/60]
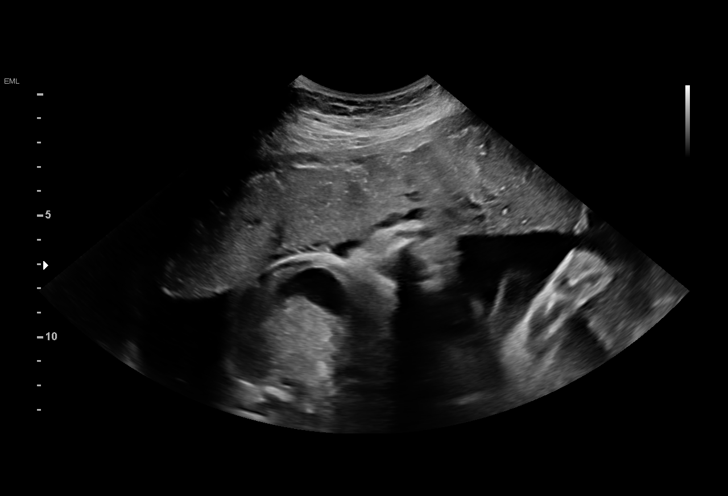
[im 11/60]
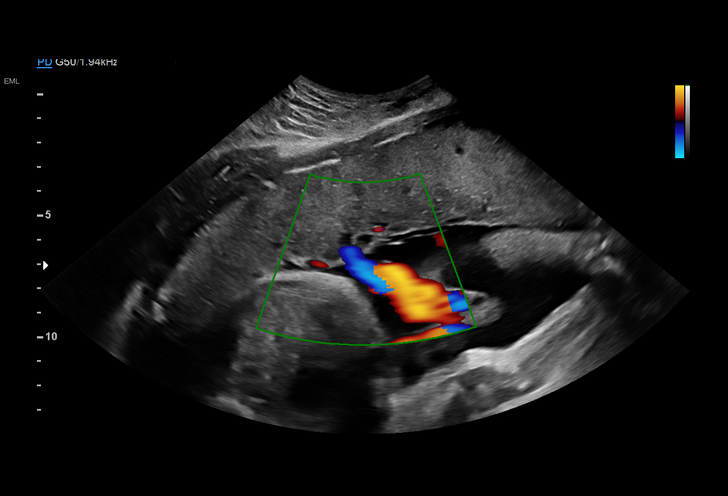
[im 16/60]
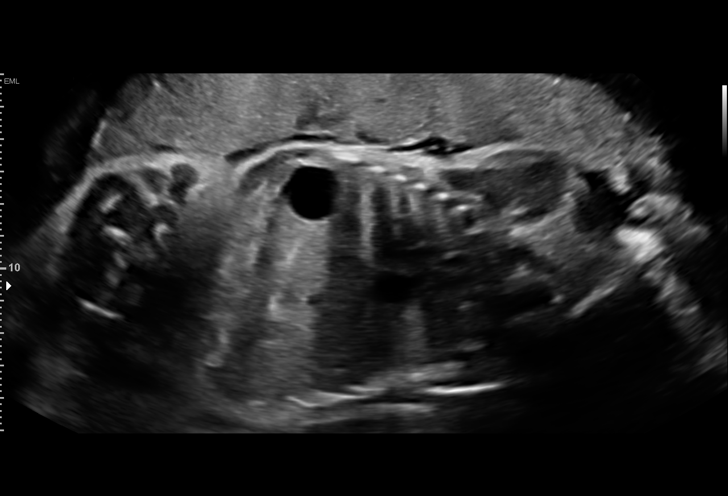
[im 20/60]
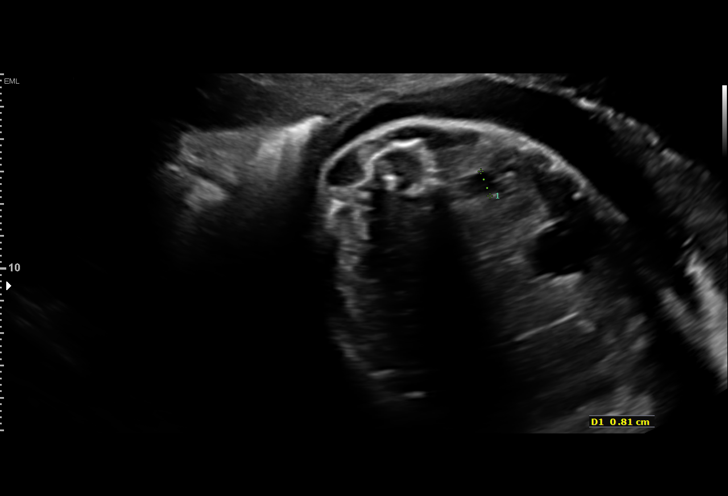
[im 25/60]
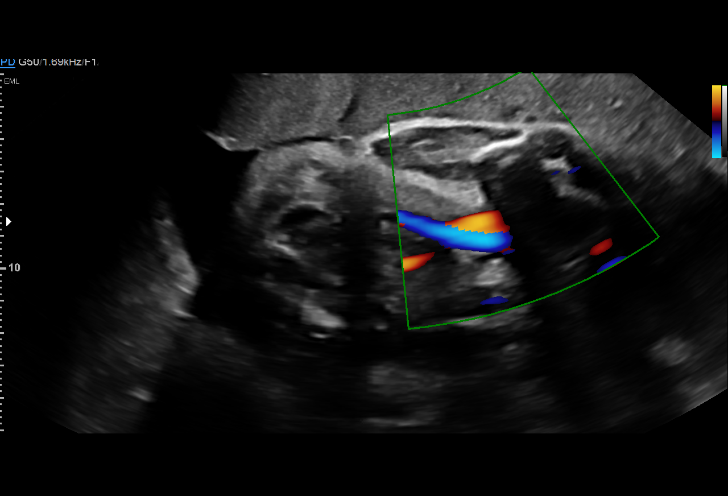
[im 31/60]
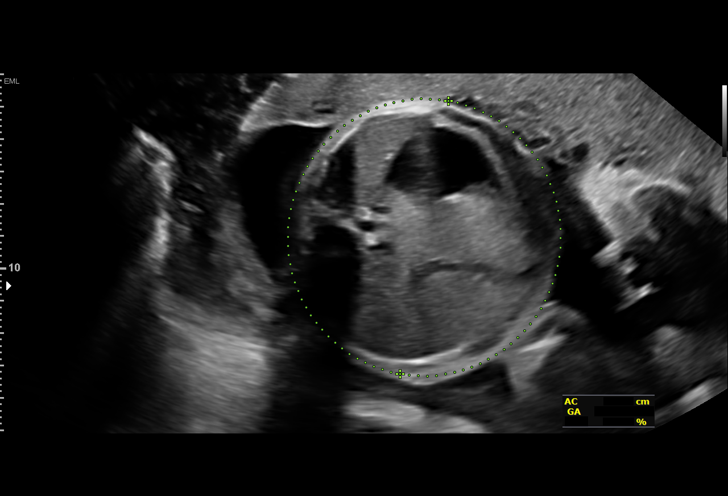
[im 35/60]
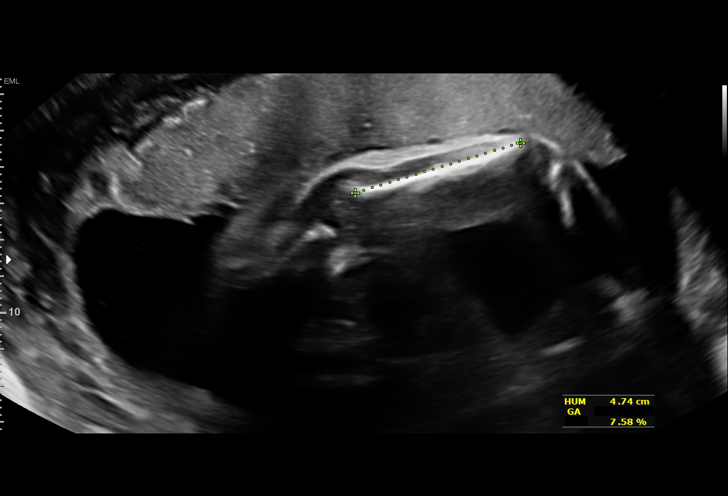
[im 40/60]
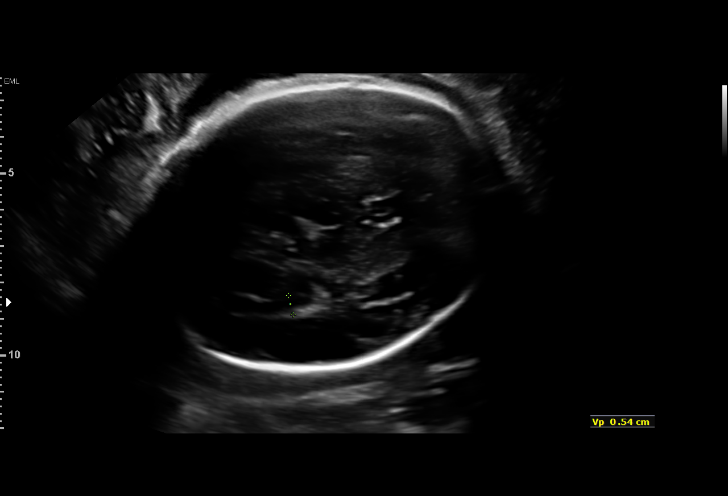
[im 44/60]
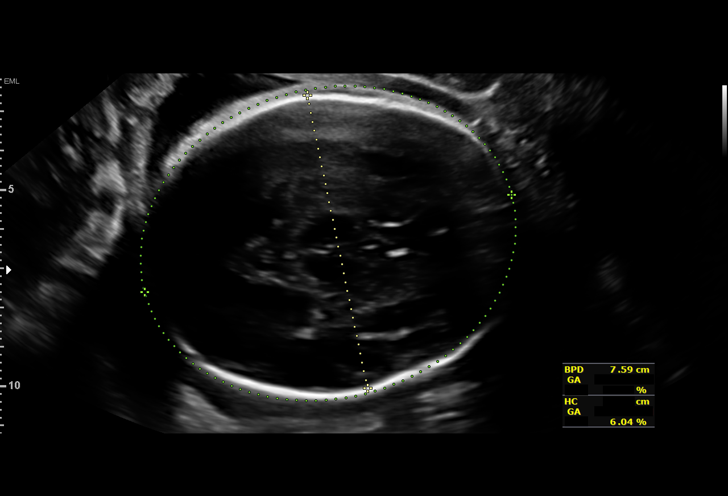
[im 49/60]
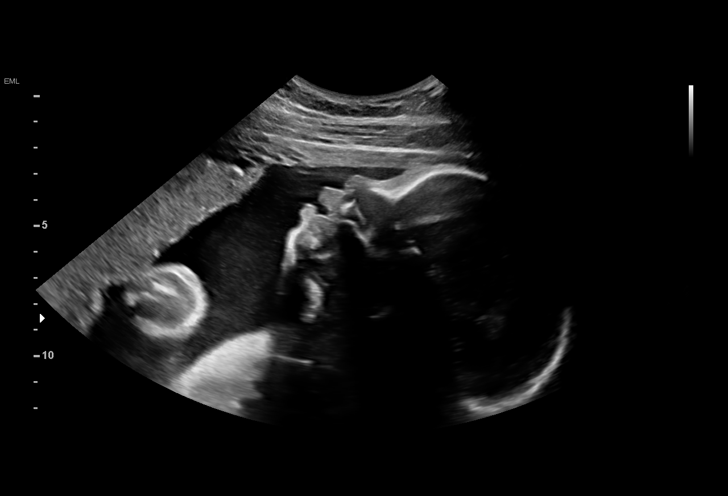
[im 53/60]
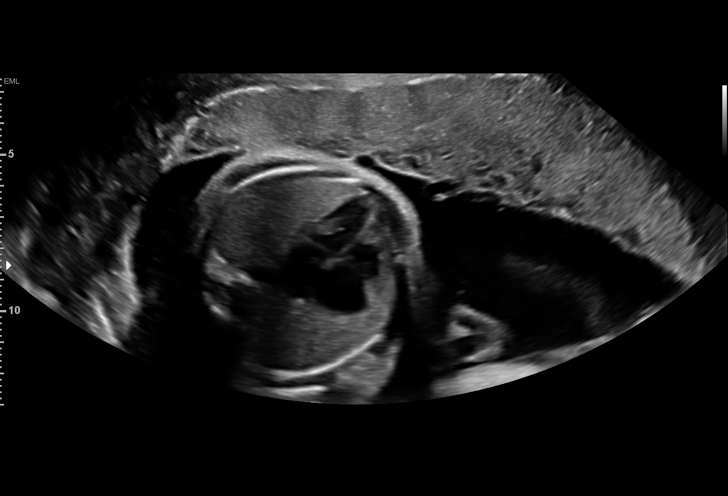
[im 57/60]
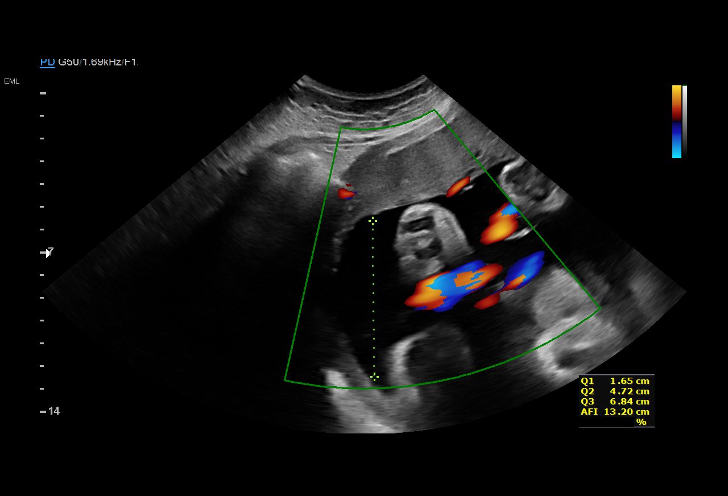

[13 of 28 positions shown; findings below may reference images not displayed]

----------------------------------------------------------------------

 ----------------------------------------------------------------------
Indications

  30 weeks gestation of pregnancy
  Antenatal follow-up for nonvisualized fetal
  anatomy
  Obesity complicating pregnancy, second
  trimester (Pre Pregnancy BMI 32)
  Fetal abnormality - other known or
  suspected (EIFLV, LT pyelectasis)
 ----------------------------------------------------------------------
Vital Signs

 BMI:
Fetal Evaluation

 Num Of Fetuses:         1
 Fetal Heart Rate(bpm):  137
 Cardiac Activity:       Observed
 Presentation:           Cephalic
 Placenta:               Anterior
 P. Cord Insertion:      Visualized, central

 Amniotic Fluid
 AFI FV:      Within normal limits

 AFI Sum(cm)     %Tile       Largest Pocket(cm)
 20.17           78

 RUQ(cm)       RLQ(cm)       LUQ(cm)        LLQ(cm)

Biometry
 BPD:      75.7  mm     G. Age:  30w 3d         24  %    CI:         73.8   %    70 - 86
                                                         FL/HC:      21.1   %    19.3 -
 HC:      279.9  mm     G. Age:  30w 5d         12  %    HC/AC:      1.05        0.96 -
 AC:      265.8  mm     G. Age:  30w 5d         41  %    FL/BPD:     77.9   %    71 - 87
 FL:         59  mm     G. Age:  30w 5d         33  %    FL/AC:      22.2   %    20 - 24
 HUM:      49.9  mm     G. Age:  29w 2d         18  %
 LV:        5.4  mm

 Est. FW:    6106  gm      3 lb 9 oz     32  %
OB History

 Gravidity:    1
Gestational Age

 LMP:           30w 6d        Date:  10/10/18                 EDD:   07/17/19
 U/S Today:     30w 5d                                        EDD:   07/18/19
 Best:          30w 6d     Det. By:  LMP  (10/10/18)          EDD:   07/17/19
Anatomy

 Cranium:               Appears normal         LVOT:                   Previously seen
 Cavum:                 Appears normal         Aortic Arch:            Previously seen
 Ventricles:            Appears normal         Ductal Arch:            Previously seen
 Choroid Plexus:        Appears normal         Diaphragm:              Appears normal
 Cerebellum:            Appears normal         Stomach:                Appears normal, left
                                                                       sided
 Posterior Fossa:       Appears normal         Abdomen:                Appears normal
 Nuchal Fold:           Not applicable (>20    Abdominal Wall:         Appears nml (cord
                        wks GA)                                        insert, abd wall)
 Face:                  Appears normal         Cord Vessels:           Appears normal (3
                        (orbits and profile)                           vessel cord)
 Lips:                  Appears normal         Kidneys:                Left UTD, 8.1 mm.
 Palate:                Previously seen        Bladder:                Appears normal
 Thoracic:              Appears normal         Spine:                  Previously seen
 Heart:                 Appears normal         Upper Extremities:      Previously seen
                        (4CH, axis, and
                        situs)
 RVOT:                  Previously seen        Lower Extremities:      Previously seen
Cervix Uterus Adnexa

 Cervix
 Normal appearance by transabdominal scan.

 Left Ovary
 Within normal limits.

 Right Ovary
 Not visualized.

 Adnexa
 No abnormality visualized.
Impression

 Urinary tract dilation (UTD) seen on previous scans.
 On ultrasound, fetal growth is appropriate for gestational age.
 Amniotic fluid is normal and good fetal activity is seen. Left
 UTD measuring 8 millimeters is seen. Both kidneys look
 normal with no increased echogenicities. Bladder appears
 normal.
 I explained the findings and reassured her.
Recommendations

 -An appointment was made for her to return in 6 weeks for
 fetal growth and renal assessments.
                 Tabero, Elvin Gs

## 2020-06-18 ENCOUNTER — Other Ambulatory Visit: Payer: Self-pay | Admitting: Student

## 2020-06-23 ENCOUNTER — Other Ambulatory Visit: Payer: Self-pay | Admitting: Student

## 2020-06-23 NOTE — Telephone Encounter (Signed)
Declined IB rf

## 2020-07-18 IMAGING — US US MFM OB FOLLOW-UP
1 series · 13 of 28 positions shown · non-contrast
Comparison: none

[Series 1: us mfm ob follow-up · 13 of 36 slices shown]
[im 2/36]
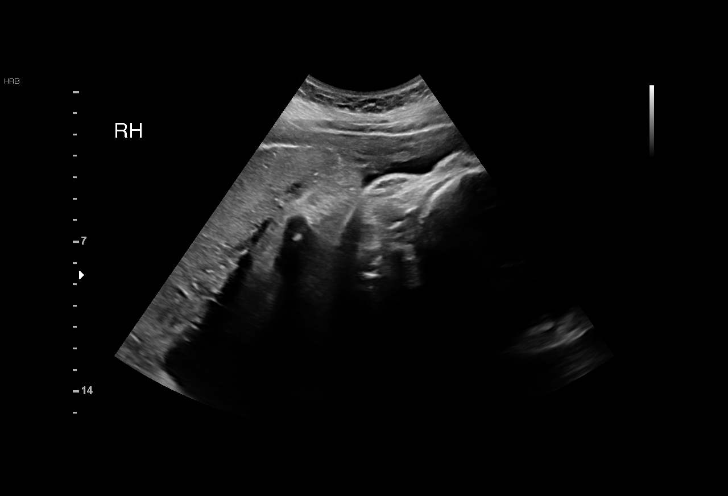
[im 4/36]
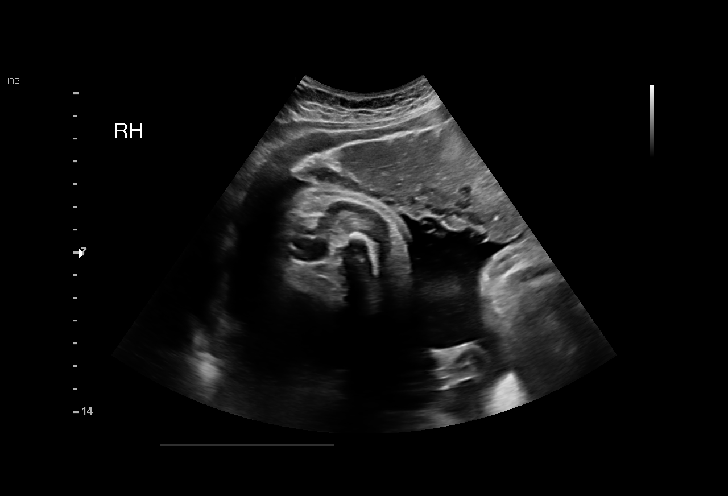
[im 7/36]
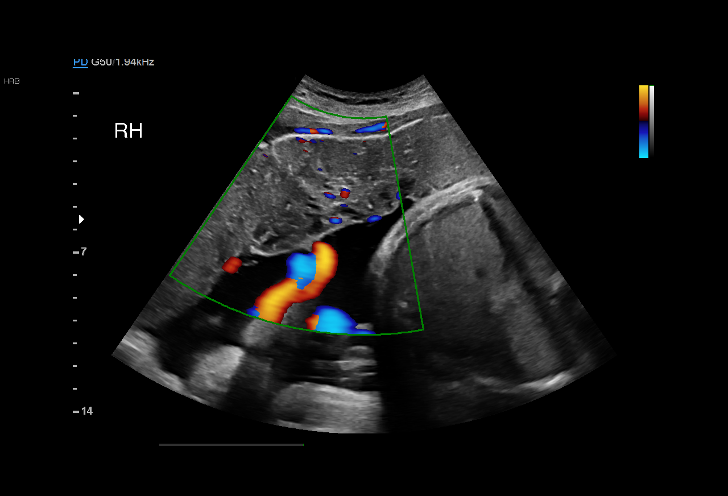
[im 10/36]
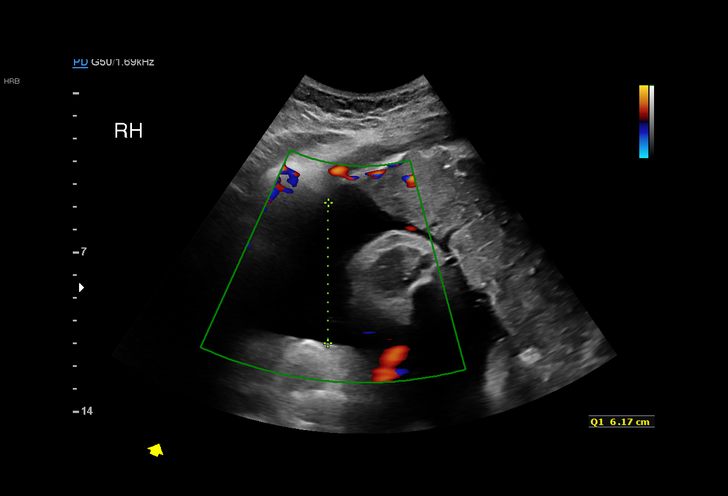
[im 12/36]
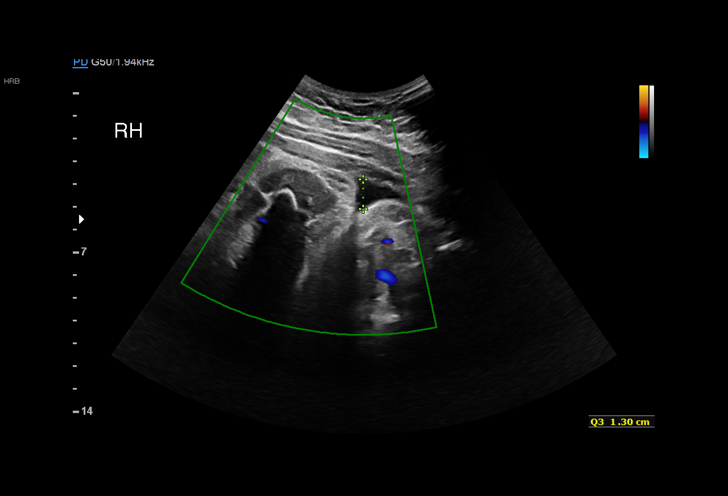
[im 15/36]
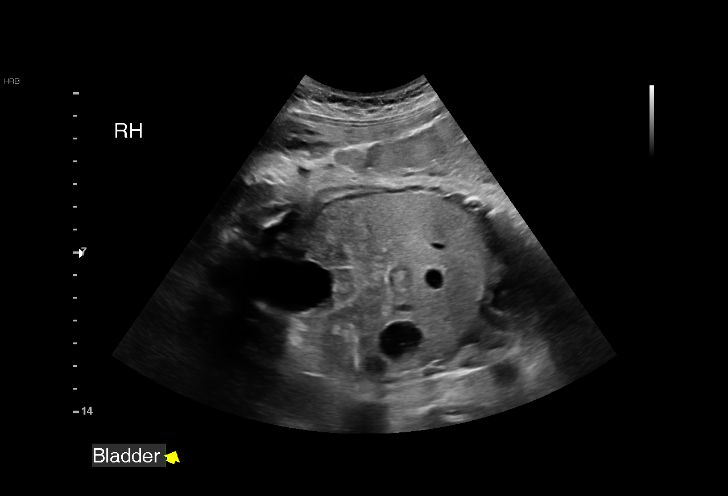
[im 19/36]
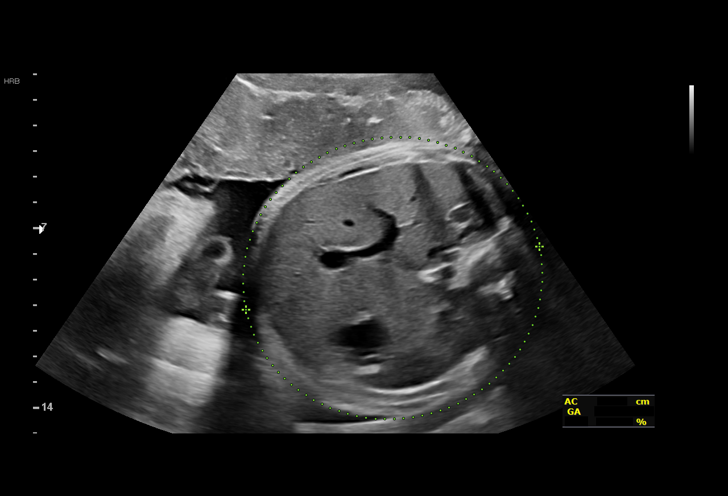
[im 21/36]
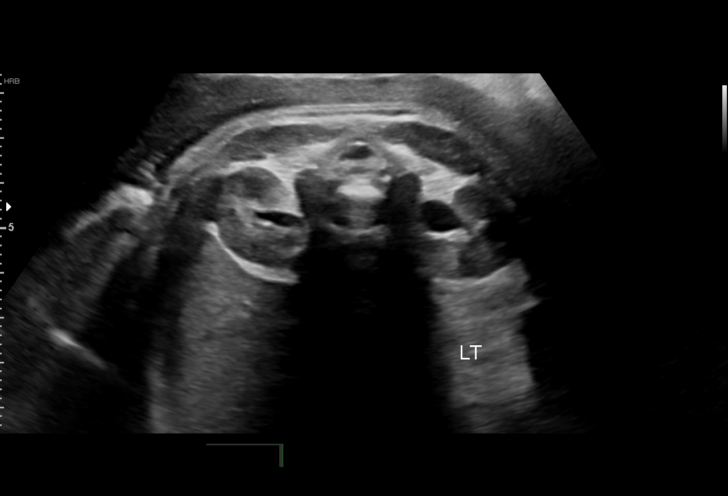
[im 24/36]
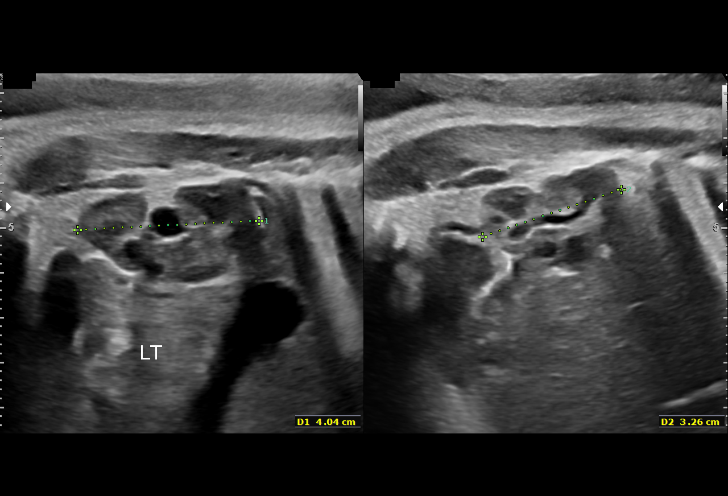
[im 26/36]
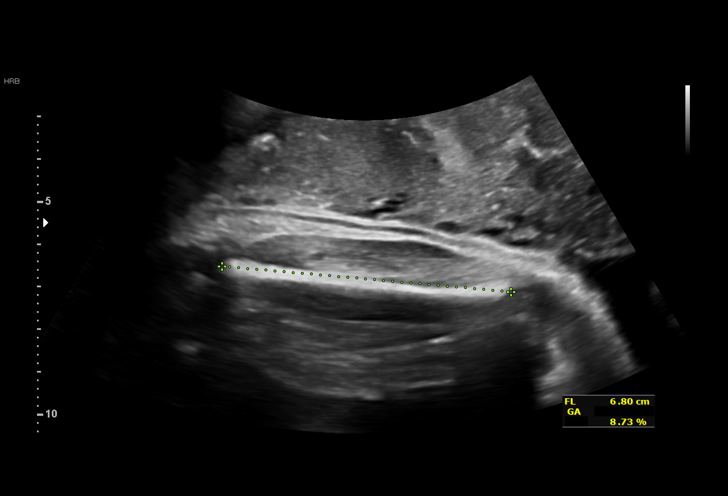
[im 29/36]
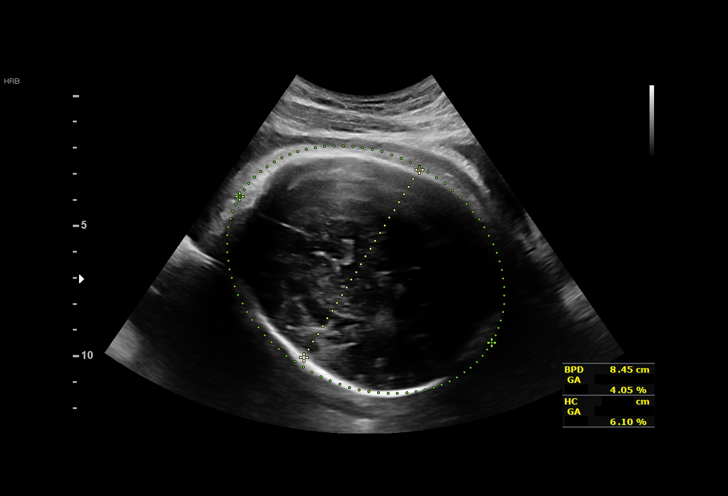
[im 32/36]
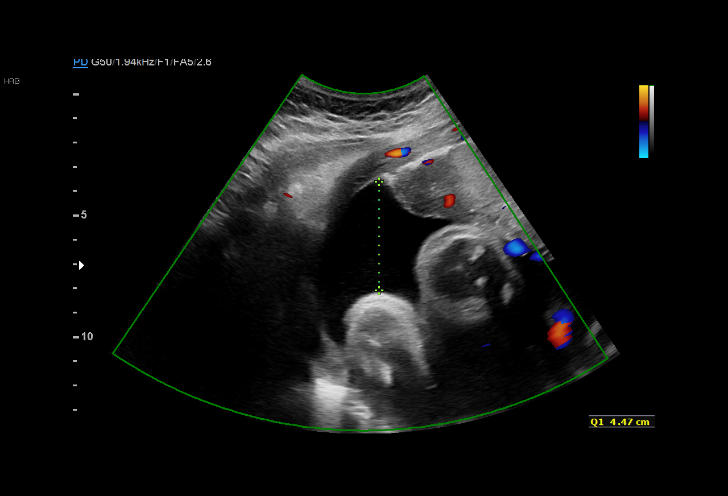
[im 34/36]
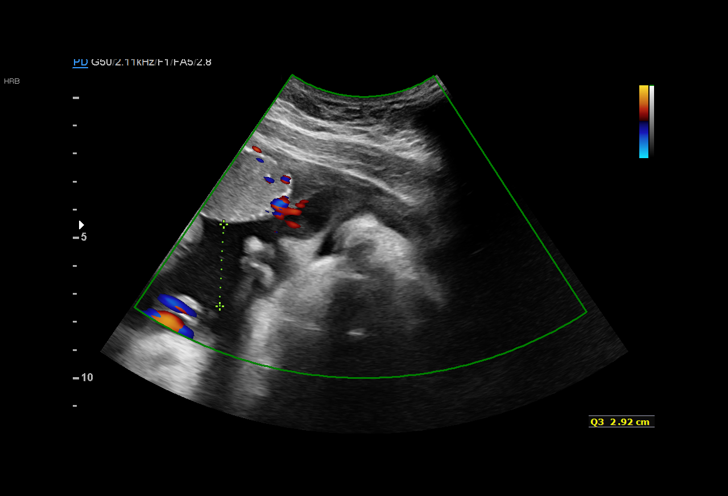

[13 of 28 positions shown; findings below may reference images not displayed]

----------------------------------------------------------------------

 ----------------------------------------------------------------------
Indications

  Fetal abnormality - other known or
  suspected (EIFLV, LT pyelectasis)
  Antenatal follow-up for nonvisualized fetal
  anatomy
  36 weeks gestation of pregnancy
 ----------------------------------------------------------------------
Vital Signs

 BMI:
Fetal Evaluation

 Num Of Fetuses:         1
 Fetal Heart Rate(bpm):  154
 Cardiac Activity:       Observed
 Presentation:           Cephalic
 Placenta:               Anterior
 P. Cord Insertion:      Previously Visualized

 Amniotic Fluid
 AFI FV:      Within normal limits

 AFI Sum(cm)     %Tile       Largest Pocket(cm)
 12.11           39

 RUQ(cm)       RLQ(cm)       LUQ(cm)        LLQ(cm)

Biometry

 BPD:        86  mm     G. Age:  34w 5d         10  %    CI:        76.03   %    70 - 86
                                                         FL/HC:      21.7   %    20.8 -
 HC:      312.6  mm     G. Age:  35w 0d          2  %    HC/AC:      0.88        0.92 -
 AC:      354.3  mm     G. Age:  39w 2d         99  %    FL/BPD:     79.0   %    71 - 87
 FL:       67.9  mm     G. Age:  34w 6d          8  %    FL/AC:      19.2   %    20 - 24
 HUM:      57.6  mm     G. Age:  33w 3d        < 5  %

 Est. FW:    5629  gm           7 lb     67  %
OB History

 Gravidity:    1
Gestational Age

 LMP:           36w 6d        Date:  10/10/18                 EDD:   07/17/19
 U/S Today:     36w 0d                                        EDD:   07/23/19
 Best:          36w 6d     Det. By:  LMP  (10/10/18)          EDD:   07/17/19
Anatomy

 Cranium:               Appears normal         LVOT:                   Previously seen
 Cavum:                 Appears normal         Aortic Arch:            Previously seen
 Ventricles:            Previously seen        Ductal Arch:            Previously seen
 Choroid Plexus:        Previously seen        Diaphragm:              Appears normal
 Cerebellum:            Previously seen        Stomach:                Appears normal, left
                                                                       sided
 Posterior Fossa:       Previously seen        Abdomen:                Appears normal
 Nuchal Fold:           Not applicable (>20    Abdominal Wall:         Previously seen
                        wks GA)
 Face:                  Orbits and profile     Cord Vessels:           Previously seen
                        previously seen
 Lips:                  Previously seen        Kidneys:                Left sided
                                                                       pyelectasis,
                                                                       mm
 Palate:                Previously seen        Bladder:                Appears normal
 Thoracic:              Appears normal         Spine:                  Previously seen
 Heart:                 Previously seen        Upper Extremities:      Previously seen
 RVOT:                  Previously seen        Lower Extremities:      Previously seen
Cervix Uterus Adnexa

 Cervix
 Not visualized (advanced GA >88wks)
Comments

 This patient was seen for a follow up growth scan due to left
 pyelectasis noted during her prior ultrasound exams.  She
 denies any problems since her last exam.
 She was informed that the fetal growth and amniotic fluid
 level appears appropriate for her gestational age.
 The left renal pelvis measures 0.7 cm dilated today.  The
 right fetal kidney appears within normal limits.  The patient
 was reassured that the left renal pelvis dilatation is most likely
 a normal variant at her current gestational age.  She was
 advised to notify her pediatrician after delivery regarding the
 left pyelectasis that was noted during her prenatal
 ultrasounds.  Her pediatrician will order additional imaging
 studies of the baby's kidneys after birth if necessary.
 Follow-up as indicated.

## 2020-08-26 ENCOUNTER — Other Ambulatory Visit (INDEPENDENT_AMBULATORY_CARE_PROVIDER_SITE_OTHER): Payer: Self-pay | Admitting: Physician Assistant

## 2020-10-06 ENCOUNTER — Other Ambulatory Visit: Payer: Self-pay

## 2020-10-06 ENCOUNTER — Ambulatory Visit (INDEPENDENT_AMBULATORY_CARE_PROVIDER_SITE_OTHER): Payer: 59 | Admitting: Adult Health

## 2020-10-06 ENCOUNTER — Encounter: Payer: Self-pay | Admitting: Adult Health

## 2020-10-06 VITALS — BP 138/88 | HR 102 | Ht 62.0 in | Wt 180.0 lb

## 2020-10-06 DIAGNOSIS — F411 Generalized anxiety disorder: Secondary | ICD-10-CM

## 2020-10-06 DIAGNOSIS — F909 Attention-deficit hyperactivity disorder, unspecified type: Secondary | ICD-10-CM | POA: Diagnosis not present

## 2020-10-06 DIAGNOSIS — F331 Major depressive disorder, recurrent, moderate: Secondary | ICD-10-CM

## 2020-10-06 MED ORDER — SERTRALINE HCL 100 MG PO TABS: 100.0000 mg | ORAL_TABLET | Freq: Every day | ORAL | 5 refills | Status: DC

## 2020-10-06 NOTE — Progress Notes (Signed)
[facility] MD/PA/NP Initial Note  10/06/2020 11:04 AM Kim Stanton  MRN:  [id number]  Chief Complaint:   HPI:   Describes mood today as "ok". Pleasant. Tearful at times. Mood symptoms - reports depression, anxiety, and irritability.  Stating "I get overwhelmed some days". Feels like the addition of Zoloft 50mg  daily has been helpful. Has felt drowsy on Zoloft - taking it in the morning. Has been able to take a "breath". Has noticed a difference but still having symptoms. Would like to increase dose to 100mg  daily. Also taking Adderall XR 10mg  daily - does not feel like it is helpful. Plans to stop breastfeeding. Does not feel like she is able to manage ADHD symptoms without it. Stating "simple tasks" are difficult. Stable interest and motivation. Taking medications as prescribed.  Energy levels stable - higher energy. Active, does not have a regular exercise routine.  Enjoys some usual interests and activities. Single. Lives with father of her 57 month old daughter. Family local. Mother watches daughter while she works. Spending time with family. Appetite adequate. Weight stable 180 pounds. Sleeps well most nights. Averages 6 to 7 hours. 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 Started Adderall XR 10mg  daily. Completing tasks. Managing aspects of household. Works for [employer] full time - remote position. Full time student - .  Denies SI or HI.  Denies AH or VH.  Previous medication trial: Adderall XR  Visit Diagnosis:    ICD-10-CM   1. Generalized anxiety disorder  F41.1 sertraline (ZOLOFT) 100 MG tablet  2. Major depressive disorder, recurrent episode, moderate (HCC)  F33.1 sertraline (ZOLOFT) 100 MG tablet  3. Attention deficit hyperactivity disorder (ADHD), unspecified ADHD type  F90.9     Past Psychiatric History: Denies psychiatric hospitalization.  Past Medical History:  Past Medical History:  Diagnosis Date  . Encounter for supervision of  normal pregnancy, antepartum 12/21/2018    Nursing Staff Provider Office Location  [facility] Dating   LMP 10/10/2018 Language   English Anatomy   11/25/2018 Flu Vaccine   Declined 12/21/2018 Genetic Screen  NIPS:   AFP:   First Screen:  Quad:   TDaP vaccine    Hgb A1C or  GTT Early  Third trimester  Rhogam     LAB RESULTS  Feeding Plan  Breast Blood Type --/--/AB POS Performed at [facility], [address] [place], 12/23/2018 [facility]  . 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 Medical history non-contributory     Past Surgical History:  Procedure Laterality Date  . NO PAST SURGERIES    . WISDOM TOOTH EXTRACTION      Family Psychiatric History: Mother with PTSD, depression, and anxiety. Sister with depression and anxiety..  Family History:  Family History  Problem Relation Age of Onset  . Cancer Maternal Grandmother        Breast  . Diabetes Maternal Grandmother     Social History:  Social History   Socioeconomic History  . Marital status: Single    Spouse name: Not on file  . Number of children: Not on file  . Years of education: Not on file  . Highest education level: Not on file  Occupational History  . Not on file  Tobacco Use  . Smoking status: Former Smoker    Types: Cigarettes    Quit date: 11/09/2018    Years since quitting: 1.9  . Smokeless tobacco: Never Used  Vaping Use  .  Vaping Use: Former  Substance and Sexual Activity  . Alcohol use: Not Currently    Alcohol/week: 2.0 standard drinks    Types: 2 Shots of liquor per week    Comment: often WEEKENDS (05/30/18)  . Drug use: Never  . Sexual activity: Yes    Partners: Male  Other Topics Concern  . Not on file  Social History Narrative  . Not on file   Social Determinants of Health   Financial Resource Strain: Not on file  Food Insecurity: Not on file  Transportation Needs: Not on file  Physical Activity: Not on file  Stress: Not on file  Social Connections: Not on  file    Allergies:  Allergies  Allergen Reactions  . Lactose Intolerance (Gi) Nausea Only and Other (See Comments)    Upsets the stomach    Metabolic Disorder Labs: Lab Results  Component Value Date   HGBA1C 5.2 12/21/2018   No results found for: PROLACTIN No results found for: CHOL, TRIG, HDL, CHOLHDL, VLDL, LDLCALC No results found for: TSH  Therapeutic Level Labs: No results found for: LITHIUM No results found for: VALPROATE No components found for:  CBMZ  Current Medications: Current Outpatient Medications  Medication Sig Dispense Refill  . sertraline (ZOLOFT) 100 MG tablet Take 1 tablet (100 mg total) by mouth daily. 30 tablet 5  . ADDERALL XR 10 MG 24 hr capsule Take 10 mg by mouth every morning.    . cephALEXin (KEFLEX) 500 MG capsule Take 1 capsule (500 mg total) by mouth 4 (four) times daily. 28 capsule 0  . cyclobenzaprine (FLEXERIL) 5 MG tablet TAKE 1-2 TABLETS (5-10 MG TOTAL) BY MOUTH 3 (THREE) TIMES DAILY AS NEEDED FOR MUSCLE SPASMS. 30 tablet 3  . ibuprofen (ADVIL) 600 MG tablet Take 1 tablet (600 mg total) by mouth every 6 (six) hours as needed. 30 tablet 0  . Prenatal Vit-Fe Fumarate-FA (PRENATAL VITAMINS PLUS PO) Take by mouth.     No current facility-administered medications for this visit.    Medication Side Effects: none  Orders placed this visit:  No orders of the defined types were placed in this encounter.   Psychiatric Specialty Exam:  Review of Systems  Musculoskeletal: Negative for gait problem.  Neurological: Negative for tremors.  Psychiatric/Behavioral:       Please refer to HPI    Blood pressure 138/88, pulse (!) 102, height 5\' 2"  (1.575 m), weight 180 lb (81.6 kg), currently breastfeeding.Body mass index is 32.92 kg/m.  General Appearance: Casual, Neat and Well Groomed  Eye Contact:  Good  Speech:  Clear and Coherent and Normal Rate  Volume:  Normal  Mood:  Anxious, Depressed and Irritable  Affect:  Appropriate and Congruent   Thought Process:  Coherent and Descriptions of Associations: Intact  Orientation:  Full (Time, Place, and Person)  Thought Content: Logical   Suicidal Thoughts:  No  Homicidal Thoughts:  No  Memory:  WNL  Judgement:  Good  Insight:  Good  Psychomotor Activity:  Normal  Concentration:  Concentration: Good  Recall:  Good  Fund of Knowledge: Good  Language: Good  Assets:  Communication Skills Desire for Improvement Financial Resources/Insurance Housing Intimacy Leisure Time Physical Health Resilience Social Support Talents/Skills Transportation Vocational/Educational  ADL's:  Intact  Cognition: WNL  Prognosis:  Good   Screenings: MDQ  Receiving Psychotherapy: No   Treatment Plan/Recommendations:   Plan:  PDMP reviewed  1. Adderall XR 10mg  daily - will d/c until not breastfeeding 2. Zoloft 50mg  daily  -  increase to 100mg  daily  Read and reviewed note with patient for accuracy.   RTC 4 weeks  Patient advised to contact office with any questions, adverse effects, or acute worsening in signs and symptoms.  Discussed potential benefits, risks, and side effects of stimulants with patient to include increased heart rate, palpitations, insomnia, increased anxiety, increased irritability, or decreased appetite.  Instructed patient to contact office if experiencing any significant tolerability issues.    , NP

## 2020-10-28 ENCOUNTER — Other Ambulatory Visit: Payer: Self-pay | Admitting: Adult Health

## 2020-10-28 DIAGNOSIS — F411 Generalized anxiety disorder: Secondary | ICD-10-CM

## 2020-10-28 DIAGNOSIS — F331 Major depressive disorder, recurrent, moderate: Secondary | ICD-10-CM

## 2020-11-03 ENCOUNTER — Ambulatory Visit (INDEPENDENT_AMBULATORY_CARE_PROVIDER_SITE_OTHER): Payer: 59 | Admitting: Adult Health

## 2020-11-03 ENCOUNTER — Other Ambulatory Visit: Payer: Self-pay

## 2020-11-03 ENCOUNTER — Encounter: Payer: Self-pay | Admitting: Adult Health

## 2020-11-03 DIAGNOSIS — F411 Generalized anxiety disorder: Secondary | ICD-10-CM

## 2020-11-03 DIAGNOSIS — F331 Major depressive disorder, recurrent, moderate: Secondary | ICD-10-CM | POA: Diagnosis not present

## 2020-11-03 DIAGNOSIS — F909 Attention-deficit hyperactivity disorder, unspecified type: Secondary | ICD-10-CM

## 2020-11-03 MED ORDER — AMPHETAMINE-DEXTROAMPHET ER 20 MG PO CP24: 20.0000 mg | ORAL_CAPSULE | Freq: Every day | ORAL | 0 refills | Status: DC

## 2020-11-03 NOTE — Progress Notes (Signed)
Kim Stanton [id number] 1990/11/02 29 y.o.  Subjective:   Patient ID:  Kim Stanton is a 30 y.o. (DOB September 16, 1991) female.  Chief Complaint: No chief complaint on file.   HPI Kim Stanton presents to the office today for follow-up of MDD, GAD, and ADHD.   Describes mood today as "ok". Pleasant. Tearful at times. Mood symptoms - reports decreased depression and irritability. Feels more anxious overall. Has feelings of "fear". Procrastinating. Increase in Zoloft has helped with mood swings with increase in Zoloft. Has stopped breast feeding. Plans to restart Adderall XR 10mg  daily. Stable interest and motivation. Taking medications as prescribed.  Energy levels stable. Active, does not have a regular exercise routine.  Enjoys some usual interests and activities. Single. Lives with father of her 8 month old daughter. Family local. Mother watches daughter while she works. Spending time with family. Appetite adequate. Weight stable 180 pounds. Sleeps well most nights. Averages 6 to 7 hours. Focus and concentration difficulties. Plans to restart Adderall. Did not feel like the XR 10mg  was effective and would like to increase doese. Completing tasks. Managing aspects of household. Works for [employer] full time - remote position. Full time student - 18.  Denies SI or HI.  Denies AH or VH.  Previous medication trial: Adderall XR  Review of Systems:  Review of Systems  Musculoskeletal: Negative for gait problem.  Neurological: Negative for tremors.  Psychiatric/Behavioral:       Please refer to HPI    Medications: I have reviewed the patient's current medications.  Current Outpatient Medications  Medication Sig Dispense Refill  . amphetamine-dextroamphetamine (ADDERALL XR) 20 MG 24 hr capsule Take 1 capsule (20 mg total) by mouth daily. 30 capsule 0  . [START ON 12/01/2020] amphetamine-dextroamphetamine (ADDERALL XR) 20 MG 24 hr capsule Take 1 capsule (20 mg total) by mouth  daily. 30 capsule 0  . cephALEXin (KEFLEX) 500 MG capsule Take 1 capsule (500 mg total) by mouth 4 (four) times daily. 28 capsule 0  . cyclobenzaprine (FLEXERIL) 5 MG tablet TAKE 1-2 TABLETS (5-10 MG TOTAL) BY MOUTH 3 (THREE) TIMES DAILY AS NEEDED FOR MUSCLE SPASMS. 30 tablet 3  . ibuprofen (ADVIL) 600 MG tablet Take 1 tablet (600 mg total) by mouth every 6 (six) hours as needed. 30 tablet 0  . Prenatal Vit-Fe Fumarate-FA (PRENATAL VITAMINS PLUS PO) Take by mouth.    . sertraline (ZOLOFT) 100 MG tablet Take 1 tablet (100 mg total) by mouth daily. 30 tablet 5   No current facility-administered medications for this visit.    Medication Side Effects: None  Allergies:  Allergies  Allergen Reactions  . Lactose Intolerance (Gi) Nausea Only and Other (See Comments)    Upsets the stomach    Past Medical History:  Diagnosis Date  . Encounter for supervision of normal pregnancy, antepartum 12/21/2018    Nursing Staff Provider Office Location  [facility] Dating   LMP 10/10/2018 Language   English Anatomy 12/23/2018   11/25/2018 Flu Vaccine   Declined 12/21/2018 Genetic Screen  NIPS:   AFP:   First Screen:  Quad:   TDaP vaccine    Hgb A1C or  GTT Early  Third trimester  Rhogam     LAB RESULTS  Feeding Plan  Breast Blood Type --/--/AB POS Performed at [facility], [address], [place], [address] [place]  . 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 Medical history non-contributory  Family History  Problem Relation Age of Onset  . Cancer Maternal Grandmother        Breast  . Diabetes Maternal Grandmother     Social History   Socioeconomic History  . Marital status: Single    Spouse name: Not on file  . Number of children: Not on file  . Years of education: Not on file  . Highest education level: Not on file  Occupational History  . Not on file  Tobacco Use  . Smoking status: Former Smoker    Types: Cigarettes    Quit date: 11/09/2018    Years since  quitting: 1.9  . Smokeless tobacco: Never Used  Vaping Use  . Vaping Use: Former  Substance and Sexual Activity  . Alcohol use: Not Currently    Alcohol/week: 2.0 standard drinks    Types: 2 Shots of liquor per week    Comment: often WEEKENDS (05/30/18)  . Drug use: Never  . Sexual activity: Yes    Partners: Male  Other Topics Concern  . Not on file  Social History Narrative  . Not on file   Social Determinants of Health   Financial Resource Strain: Not on file  Food Insecurity: Not on file  Transportation Needs: Not on file  Physical Activity: Not on file  Stress: Not on file  Social Connections: Not on file  Intimate Partner Violence: Not on file    Past Medical History, Surgical history, Social history, and Family history were reviewed and updated as appropriate.   Please see review of systems for further details on the patient's review from today.   Objective:   Physical Exam:  There were no vitals taken for this visit.  Physical Exam Constitutional:      General: She is not in acute distress. Musculoskeletal:        General: No deformity.  Neurological:     Mental Status: She is alert and oriented to person, place, and time.     Coordination: Coordination normal.  Psychiatric:        Attention and Perception: Attention and perception normal. She does not perceive auditory or visual hallucinations.        Mood and Affect: Mood normal. Mood is not anxious or depressed. Affect is not labile, blunt, angry or inappropriate.        Speech: Speech normal.        Behavior: Behavior normal.        Thought Content: Thought content normal. Thought content is not paranoid or delusional. Thought content does not include homicidal or suicidal ideation. Thought content does not include homicidal or suicidal plan.        Cognition and Memory: Cognition and memory normal.        Judgment: Judgment normal.     Comments: Insight intact     Lab Review:     Component Value  Date/Time   NA 134 (L) 07/16/2019 1950   NA 137 06/15/2019 1047   K 4.0 07/16/2019 1950   CL 104 07/16/2019 1950   CO2 19 (L) 07/16/2019 1950   GLUCOSE 91 07/16/2019 1950   BUN 14 07/16/2019 1950   BUN 5 (L) 06/15/2019 1047   CREATININE 0.69 07/16/2019 1950   CALCIUM 9.4 07/16/2019 1950   PROT 6.5 07/16/2019 1950   PROT 6.3 06/15/2019 1047   ALBUMIN 3.4 (L) 07/16/2019 1950   ALBUMIN 3.9 06/15/2019 1047   AST 25 07/16/2019 1950   ALT 13 07/16/2019 1950   ALKPHOS 196 (  H) 07/16/2019 1950   BILITOT 0.4 07/16/2019 1950   BILITOT <0.2 06/15/2019 1047   GFRNONAA >60 07/16/2019 1950   GFRAA >60 07/16/2019 1950       Component Value Date/Time   WBC 7.0 07/16/2019 1923   RBC 4.27 07/16/2019 1923   HGB 12.8 07/16/2019 1923   HGB 11.4 05/02/2019 1015   HCT 38.4 07/16/2019 1923   HCT 34.7 05/02/2019 1015   PLT 333 07/16/2019 1923   PLT 288 05/02/2019 1015   MCV 89.9 07/16/2019 1923   MCV 91 05/02/2019 1015   MCH 30.0 07/16/2019 1923   MCHC 33.3 07/16/2019 1923   RDW 14.6 07/16/2019 1923   RDW 13.2 05/02/2019 1015   LYMPHSABS 2.7 12/21/2018 1133   MONOABS 0.5 05/24/2018 1249   EOSABS 0.1 12/21/2018 1133   BASOSABS 0.0 12/21/2018 1133    No results found for: POCLITH, LITHIUM   No results found for: PHENYTOIN, PHENOBARB, VALPROATE, CBMZ   .res Assessment: Plan:    Plan:  PDMP reviewed  1. Add Adderall XR 20mg  daily  2. Zoloft 100mg  daily  Read and reviewed note with patient for accuracy.   RTC 4 weeks  Patient advised to contact office with any questions, adverse effects, or acute worsening in signs and symptoms.  Discussed potential benefits, risks, and side effects of stimulants with patient to include increased heart rate, palpitations, insomnia, increased anxiety, increased irritability, or decreased appetite.  Instructed patient to contact office if experiencing any significant tolerability issues.   Diagnoses and all orders for this visit:  Generalized  anxiety disorder  Major depressive disorder, recurrent episode, moderate (HCC)  Attention deficit hyperactivity disorder (ADHD), unspecified ADHD type -     amphetamine-dextroamphetamine (ADDERALL XR) 20 MG 24 hr capsule; Take 1 capsule (20 mg total) by mouth daily. -     amphetamine-dextroamphetamine (ADDERALL XR) 20 MG 24 hr capsule; Take 1 capsule (20 mg total) by mouth daily.     Please see After Visit Summary for patient specific instructions.  No future appointments.  No orders of the defined types were placed in this encounter.   -------------------------------

## 2020-12-01 ENCOUNTER — Ambulatory Visit (INDEPENDENT_AMBULATORY_CARE_PROVIDER_SITE_OTHER): Payer: 59 | Admitting: Adult Health

## 2020-12-01 ENCOUNTER — Encounter: Payer: Self-pay | Admitting: Adult Health

## 2020-12-01 ENCOUNTER — Other Ambulatory Visit: Payer: Self-pay

## 2020-12-01 DIAGNOSIS — F909 Attention-deficit hyperactivity disorder, unspecified type: Secondary | ICD-10-CM

## 2020-12-01 DIAGNOSIS — F411 Generalized anxiety disorder: Secondary | ICD-10-CM

## 2020-12-01 DIAGNOSIS — F331 Major depressive disorder, recurrent, moderate: Secondary | ICD-10-CM

## 2020-12-01 MED ORDER — AMPHETAMINE-DEXTROAMPHET ER 20 MG PO CP24: 20.0000 mg | ORAL_CAPSULE | Freq: Two times a day (BID) | ORAL | 0 refills | Status: DC

## 2020-12-01 NOTE — Progress Notes (Addendum)
Kim Stanton [id number] 1991-05-16 30 y.o.  Subjective:   Patient ID:  Kim Stanton is a 30 y.o. (DOB Sep 11, 1991) female.  Chief Complaint: No chief complaint on file.   HPI Kim Stanton presents to the office today for follow-up of MDD, GAD, and ADHD.   Describes mood today as "ok". Pleasant. Tearful at times. Mood symptoms - reports decreased, anxiety. depression and irritability. Feels like anxiety is "much better". Mood is "more" level. Stating "I'm not as anxious or fearful". Not procrastinating as much in the mornings - Adderall "wears off" around 1:00. Not having as many feelings of "OCD". Is able to let things go a lot "easier" than before. Stable interest and motivation. Taking medications as prescribed.  Energy levels stable. Active, does not have a regular exercise routine.  Enjoys some usual interests and activities. Single. Lives with father of her 16 month old daughter. Family local. Mother watches daughter while she works. Spending time with family. Appetite adequate. Weight stable 178 pounds. Sleeps well most nights. Averages 6 to 7 hours. Focus and concentration difficulties after lunch. Completing tasks. Managing aspects of household. Works for [employer] full time - remote position. 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  Denies SI or HI.  Denies AH or VH.  Previous medication trial: Adderall XR  Review of Systems:  Review of Systems  Musculoskeletal: Negative for gait problem.  Neurological: Negative for tremors.  Psychiatric/Behavioral:       Please refer to HPI    Medications: I have reviewed the patient's current medications.  Current Outpatient Medications  Medication Sig Dispense Refill  . amphetamine-dextroamphetamine (ADDERALL XR) 20 MG 24 hr capsule Take 1 capsule (20 mg total) by mouth 2 (two) times daily. 60 capsule 0  . [START ON 12/29/2020] amphetamine-dextroamphetamine (ADDERALL XR) 20 MG 24 hr capsule Take 1 capsule (20 mg total) by mouth 2 (two)  times daily. 60 capsule 0  . cephALEXin (KEFLEX) 500 MG capsule Take 1 capsule (500 mg total) by mouth 4 (four) times daily. 28 capsule 0  . cyclobenzaprine (FLEXERIL) 5 MG tablet TAKE 1-2 TABLETS (5-10 MG TOTAL) BY MOUTH 3 (THREE) TIMES DAILY AS NEEDED FOR MUSCLE SPASMS. 30 tablet 3  . ibuprofen (ADVIL) 600 MG tablet Take 1 tablet (600 mg total) by mouth every 6 (six) hours as needed. 30 tablet 0  . Prenatal Vit-Fe Fumarate-FA (PRENATAL VITAMINS PLUS PO) Take by mouth.    . sertraline (ZOLOFT) 100 MG tablet Take 1 tablet (100 mg total) by mouth daily. 30 tablet 5   No current facility-administered medications for this visit.    Medication Side Effects: None  Allergies:  Allergies  Allergen Reactions  . Lactose Intolerance (Gi) Nausea Only and Other (See Comments)    Upsets the stomach    Past Medical History:  Diagnosis Date  . Encounter for supervision of normal pregnancy, antepartum 12/21/2018    Nursing Staff Provider Office Location  [facility] Dating   LMP 10/10/2018 Language   English Anatomy US   11/25/2018 Flu Vaccine   Declined 12/21/2018 Genetic Screen  NIPS:   AFP:   First Screen:  Quad:   TDaP vaccine    Hgb A1C or  GTT Early  Third trimester  Rhogam     LAB RESULTS  Feeding Plan  Breast Blood Type --/--/AB POS Performed at [facility], [address], [place], [place] [address]  . 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 Medical  history non-contributory     Family History  Problem Relation Age of Onset  . Cancer Maternal Grandmother        Breast  . Diabetes Maternal Grandmother     Social History   Socioeconomic History  . Marital status: Single    Spouse name: Not on file  . Number of children: Not on file  . Years of education: Not on file  . Highest education level: Not on file  Occupational History  . Not on file  Tobacco Use  . Smoking status: Former Smoker    Types: Cigarettes    Quit date: 11/09/2018    Years since  quitting: 2.0  . Smokeless tobacco: Never Used  Vaping Use  . Vaping Use: Former  Substance and Sexual Activity  . Alcohol use: Not Currently    Alcohol/week: 2.0 standard drinks    Types: 2 Shots of liquor per week    Comment: often WEEKENDS (05/30/18)  . Drug use: Never  . Sexual activity: Yes    Partners: Male  Other Topics Concern  . Not on file  Social History Narrative  . Not on file   Social Determinants of Health   Financial Resource Strain: Not on file  Food Insecurity: Not on file  Transportation Needs: Not on file  Physical Activity: Not on file  Stress: Not on file  Social Connections: Not on file  Intimate Partner Violence: Not on file    Past Medical History, Surgical history, Social history, and Family history were reviewed and updated as appropriate.   Please see review of systems for further details on the patient's review from today.   Objective:   Physical Exam:  There were no vitals taken for this visit.  Physical Exam Constitutional:      General: She is not in acute distress. Musculoskeletal:        General: No deformity.  Neurological:     Mental Status: She is alert and oriented to person, place, and time.     Coordination: Coordination normal.  Psychiatric:        Attention and Perception: Attention and perception normal. She does not perceive auditory or visual hallucinations.        Mood and Affect: Mood normal. Mood is not anxious or depressed. Affect is not labile, blunt, angry or inappropriate.        Speech: Speech normal.        Behavior: Behavior normal.        Thought Content: Thought content normal. Thought content is not paranoid or delusional. Thought content does not include homicidal or suicidal ideation. Thought content does not include homicidal or suicidal plan.        Cognition and Memory: Cognition and memory normal.        Judgment: Judgment normal.     Comments: Insight intact     Lab Review:     Component Value  Date/Time   NA 134 (L) 07/16/2019 1950   NA 137 06/15/2019 1047   K 4.0 07/16/2019 1950   CL 104 07/16/2019 1950   CO2 19 (L) 07/16/2019 1950   GLUCOSE 91 07/16/2019 1950   BUN 14 07/16/2019 1950   BUN 5 (L) 06/15/2019 1047   CREATININE 0.69 07/16/2019 1950   CALCIUM 9.4 07/16/2019 1950   PROT 6.5 07/16/2019 1950   PROT 6.3 06/15/2019 1047   ALBUMIN 3.4 (L) 07/16/2019 1950   ALBUMIN 3.9 06/15/2019 1047   AST 25 07/16/2019 1950   ALT 13  07/16/2019 1950   ALKPHOS 196 (H) 07/16/2019 1950   BILITOT 0.4 07/16/2019 1950   BILITOT <0.2 06/15/2019 1047   GFRNONAA >60 07/16/2019 1950   GFRAA >60 07/16/2019 1950       Component Value Date/Time   WBC 7.0 07/16/2019 1923   RBC 4.27 07/16/2019 1923   HGB 12.8 07/16/2019 1923   HGB 11.4 05/02/2019 1015   HCT 38.4 07/16/2019 1923   HCT 34.7 05/02/2019 1015   PLT 333 07/16/2019 1923   PLT 288 05/02/2019 1015   MCV 89.9 07/16/2019 1923   MCV 91 05/02/2019 1015   MCH 30.0 07/16/2019 1923   MCHC 33.3 07/16/2019 1923   RDW 14.6 07/16/2019 1923   RDW 13.2 05/02/2019 1015   LYMPHSABS 2.7 12/21/2018 1133   MONOABS 0.5 05/24/2018 1249   EOSABS 0.1 12/21/2018 1133   BASOSABS 0.0 12/21/2018 1133    No results found for: POCLITH, LITHIUM   No results found for: PHENYTOIN, PHENOBARB, VALPROATE, CBMZ   .res Assessment: Plan:    Plan:  PDMP reviewed  1. Increase Adderall XR 20mg  daily to BID 2. Zoloft 100mg  daily  114/72 - 85  Read and reviewed note with patient for accuracy.   RTC 4 weeks  Patient advised to contact office with any questions, adverse effects, or acute worsening in signs and symptoms.  Discussed potential benefits, risks, and side effects of stimulants with patient to include increased heart rate, palpitations, insomnia, increased anxiety, increased irritability, or decreased appetite.  Instructed patient to contact office if experiencing any significant tolerability issues.     Diagnoses and all orders for  this visit:  Major depressive disorder, recurrent episode, moderate (HCC)  Attention deficit hyperactivity disorder (ADHD), unspecified ADHD type -     amphetamine-dextroamphetamine (ADDERALL XR) 20 MG 24 hr capsule; Take 1 capsule (20 mg total) by mouth 2 (two) times daily. -     amphetamine-dextroamphetamine (ADDERALL XR) 20 MG 24 hr capsule; Take 1 capsule (20 mg total) by mouth 2 (two) times daily.  Generalized anxiety disorder     Please see After Visit Summary for patient specific instructions.  No future appointments.  No orders of the defined types were placed in this encounter.   -------------------------------

## 2020-12-04 ENCOUNTER — Telehealth: Payer: Self-pay | Admitting: Adult Health

## 2020-12-05 NOTE — Telephone Encounter (Signed)
Error

## 2020-12-08 ENCOUNTER — Telehealth: Payer: Self-pay

## 2020-12-08 NOTE — Telephone Encounter (Signed)
Pt called requesting a prior authorization for AMPHETAMINE-DEXTROAMPHETAMINE XR 20 MG 2/DAY. #60. We had not received a request from pharmacy but pt reports asking pharmacy since last week. Contacted [facility] directly and completed PA over the phone answering clinical questions. Approval received for 12 months, effective 12/08/2020-12/07/2021.    ID# [id number]

## 2021-01-12 ENCOUNTER — Ambulatory Visit: Payer: 59 | Admitting: Adult Health

## 2021-01-14 ENCOUNTER — Encounter: Payer: Self-pay | Admitting: Adult Health

## 2021-01-14 ENCOUNTER — Other Ambulatory Visit: Payer: Self-pay

## 2021-01-14 ENCOUNTER — Ambulatory Visit (INDEPENDENT_AMBULATORY_CARE_PROVIDER_SITE_OTHER): Payer: 59 | Admitting: Adult Health

## 2021-01-14 DIAGNOSIS — F331 Major depressive disorder, recurrent, moderate: Secondary | ICD-10-CM | POA: Diagnosis not present

## 2021-01-14 DIAGNOSIS — F909 Attention-deficit hyperactivity disorder, unspecified type: Secondary | ICD-10-CM | POA: Diagnosis not present

## 2021-01-14 DIAGNOSIS — F411 Generalized anxiety disorder: Secondary | ICD-10-CM | POA: Diagnosis not present

## 2021-01-14 MED ORDER — AMPHETAMINE-DEXTROAMPHET ER 20 MG PO CP24: 20.0000 mg | ORAL_CAPSULE | Freq: Two times a day (BID) | ORAL | 0 refills | Status: DC

## 2021-01-14 NOTE — Progress Notes (Signed)
Kim Stanton [id number] 07-23-1991 30 y.o.  Subjective:   Patient ID:  Kim Stanton is a 30 y.o. (DOB 11-01-1990) female.  Chief Complaint: No chief complaint on file.   HPI Kim Stanton presents to the office today for follow-up of MDD, GAD, and ADHD.   Describes mood today as "ok". Pleasant. Tearful at times. Mood symptoms - denies anxiety, depression and irritability. Mood remains level. Feels like Zoloft is making her "sleepy". Plans to split dose and take twice daily to see if that helps. Has moments of wanting things clean and in order - "not as bad". Reports increase in Adderall to twice daily has worked out well. Stable interest and motivation. Taking medications as prescribed.  Energy levels stable. Active, does not have a regular exercise routine.  Enjoys some usual interests and activities. Single. Lives with father of her 78 month old daughter. Family local. Mother watches daughter while she works. Spending time with family. Appetite adequate. Weight stable 178 pounds. Sleeps well most nights. Averages 6 to 7 hours. Focus and concentration difficulties after lunch. Completing tasks. Managing aspects of household. Works for [employer] full time - remote position. 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  Denies SI or HI.  Denies AH or VH.  Previous medication trial: Adderall XR  Review of Systems:  Review of Systems  Musculoskeletal: Negative for gait problem.  Neurological: Negative for tremors.  Psychiatric/Behavioral:       Please refer to HPI    Medications: I have reviewed the patient's current medications.  Current Outpatient Medications  Medication Sig Dispense Refill  . [START ON 03/11/2021] amphetamine-dextroamphetamine (ADDERALL XR) 20 MG 24 hr capsule Take 1 capsule (20 mg total) by mouth 2 (two) times daily. 60 capsule 0  . amphetamine-dextroamphetamine (ADDERALL XR) 20 MG 24 hr capsule Take 1 capsule (20 mg total) by mouth 2 (two)  times daily. 60 capsule 0  . [START ON 02/11/2021] amphetamine-dextroamphetamine (ADDERALL XR) 20 MG 24 hr capsule Take 1 capsule (20 mg total) by mouth 2 (two) times daily. 60 capsule 0  . cephALEXin (KEFLEX) 500 MG capsule Take 1 capsule (500 mg total) by mouth 4 (four) times daily. 28 capsule 0  . cyclobenzaprine (FLEXERIL) 5 MG tablet TAKE 1-2 TABLETS (5-10 MG TOTAL) BY MOUTH 3 (THREE) TIMES DAILY AS NEEDED FOR MUSCLE SPASMS. 30 tablet 3  . ibuprofen (ADVIL) 600 MG tablet Take 1 tablet (600 mg total) by mouth every 6 (six) hours as needed. 30 tablet 0  . Prenatal Vit-Fe Fumarate-FA (PRENATAL VITAMINS PLUS PO) Take by mouth.    . sertraline (ZOLOFT) 100 MG tablet Take 1 tablet (100 mg total) by mouth daily. 30 tablet 5   No current facility-administered medications for this visit.    Medication Side Effects: None  Allergies:  Allergies  Allergen Reactions  . Lactose Intolerance (Gi) Nausea Only and Other (See Comments)    Upsets the stomach    Past Medical History:  Diagnosis Date  . Encounter for supervision of normal pregnancy, antepartum 12/21/2018    Nursing Staff Provider Office Location  [facility] Dating   LMP 10/10/2018 Language   English Anatomy US   11/25/2018 Flu Vaccine   Declined 12/21/2018 Genetic Screen  NIPS:   AFP:   First Screen:  Quad:   TDaP vaccine    Hgb A1C or  GTT Early  Third trimester  Rhogam     LAB RESULTS  Feeding Plan  Breast Blood Type --/--/AB  POS Performed at [facility], [address] [place], [place] [address]  . 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 Medical history non-contributory     Past Medical History, Surgical history, Social history, and Family history were reviewed and updated as appropriate.   Please see review of systems for further details on the patient's review from today.   Objective:   Physical Exam:  There were no vitals taken for this visit.  Physical Exam Constitutional:      General:  She is not in acute distress. Musculoskeletal:        General: No deformity.  Neurological:     Mental Status: She is alert and oriented to person, place, and time.     Coordination: Coordination normal.  Psychiatric:        Attention and Perception: Attention and perception normal. She does not perceive auditory or visual hallucinations.        Mood and Affect: Mood normal. Mood is not anxious or depressed. Affect is not labile, blunt, angry or inappropriate.        Speech: Speech normal.        Behavior: Behavior normal.        Thought Content: Thought content normal. Thought content is not paranoid or delusional. Thought content does not include homicidal or suicidal ideation. Thought content does not include homicidal or suicidal plan.        Cognition and Memory: Cognition and memory normal.        Judgment: Judgment normal.     Comments: Insight intact     Lab Review:     Component Value Date/Time   NA 134 (L) 07/16/2019 1950   NA 137 06/15/2019 1047   K 4.0 07/16/2019 1950   CL 104 07/16/2019 1950   CO2 19 (L) 07/16/2019 1950   GLUCOSE 91 07/16/2019 1950   BUN 14 07/16/2019 1950   BUN 5 (L) 06/15/2019 1047   CREATININE 0.69 07/16/2019 1950   CALCIUM 9.4 07/16/2019 1950   PROT 6.5 07/16/2019 1950   PROT 6.3 06/15/2019 1047   ALBUMIN 3.4 (L) 07/16/2019 1950   ALBUMIN 3.9 06/15/2019 1047   AST 25 07/16/2019 1950   ALT 13 07/16/2019 1950   ALKPHOS 196 (H) 07/16/2019 1950   BILITOT 0.4 07/16/2019 1950   BILITOT <0.2 06/15/2019 1047   GFRNONAA >60 07/16/2019 1950   GFRAA >60 07/16/2019 1950       Component Value Date/Time   WBC 7.0 07/16/2019 1923   RBC 4.27 07/16/2019 1923   HGB 12.8 07/16/2019 1923   HGB 11.4 05/02/2019 1015   HCT 38.4 07/16/2019 1923   HCT 34.7 05/02/2019 1015   PLT 333 07/16/2019 1923   PLT 288 05/02/2019 1015   MCV 89.9 07/16/2019 1923   MCV 91 05/02/2019 1015   MCH 30.0 07/16/2019 1923   MCHC 33.3 07/16/2019 1923   RDW 14.6 07/16/2019  1923   RDW 13.2 05/02/2019 1015   LYMPHSABS 2.7 12/21/2018 1133   MONOABS 0.5 05/24/2018 1249   EOSABS 0.1 12/21/2018 1133   BASOSABS 0.0 12/21/2018 1133    No results found for: POCLITH, LITHIUM   No results found for: PHENYTOIN, PHENOBARB, VALPROATE, CBMZ   .res Assessment: Plan:    Plan:  PDMP reviewed  1. Adderall XR 20mg  daily to BID 2. Zoloft 100mg  daily  109/80/90  Read and reviewed note with patient for accuracy.   RTC 4 weeks  Patient advised  to contact office with any questions, adverse effects, or acute worsening in signs and symptoms.  Discussed potential benefits, risks, and side effects of stimulants with patient to include increased heart rate, palpitations, insomnia, increased anxiety, increased irritability, or decreased appetite.  Instructed patient to contact office if experiencing any significant tolerability issues.  Diagnoses and all orders for this visit:  Generalized anxiety disorder  Major depressive disorder, recurrent episode, moderate (HCC)  Attention deficit hyperactivity disorder (ADHD), unspecified ADHD type -     amphetamine-dextroamphetamine (ADDERALL XR) 20 MG 24 hr capsule; Take 1 capsule (20 mg total) by mouth 2 (two) times daily. -     amphetamine-dextroamphetamine (ADDERALL XR) 20 MG 24 hr capsule; Take 1 capsule (20 mg total) by mouth 2 (two) times daily. -     amphetamine-dextroamphetamine (ADDERALL XR) 20 MG 24 hr capsule; Take 1 capsule (20 mg total) by mouth 2 (two) times daily.     Please see After Visit Summary for patient specific instructions.  No future appointments.  No orders of the defined types were placed in this encounter.   -------------------------------

## 2021-02-03 ENCOUNTER — Other Ambulatory Visit (HOSPITAL_COMMUNITY)
Admission: RE | Admit: 2021-02-03 | Discharge: 2021-02-03 | Disposition: A | Discharge status: Home / Self Care | Payer: 59 | Source: Ambulatory Visit | Attending: Obstetrics & Gynecology | Admitting: Obstetrics & Gynecology

## 2021-02-03 ENCOUNTER — Other Ambulatory Visit: Payer: Self-pay

## 2021-02-03 ENCOUNTER — Ambulatory Visit: Payer: 59

## 2021-02-03 DIAGNOSIS — N898 Other specified noninflammatory disorders of vagina: Secondary | ICD-10-CM

## 2021-02-03 DIAGNOSIS — B9689 Other specified bacterial agents as the cause of diseases classified elsewhere: Secondary | ICD-10-CM | POA: Insufficient documentation

## 2021-02-03 DIAGNOSIS — N760 Acute vaginitis: Secondary | ICD-10-CM | POA: Diagnosis not present

## 2021-02-03 DIAGNOSIS — Z113 Encounter for screening for infections with a predominantly sexual mode of transmission: Secondary | ICD-10-CM | POA: Diagnosis not present

## 2021-02-03 NOTE — Progress Notes (Signed)
SUBJECTIVE:  30 y.o. female complains of white, thick vaginal discharge for 1 week(s). Denies abnormal vaginal bleeding or significant pelvic pain or fever. No UTI symptoms. Denies history of known exposure to STD.  No LMP recorded.  OBJECTIVE:  She appears well, afebrile. Urine dipstick: not done.  ASSESSMENT:  Vaginal Discharge, irritation, but denies odor. She would also like to be tested for Std due to having a new partner.   PLAN:  GC, chlamydia, trichomonas, BVAG, CVAG probe sent to lab. Treatment: To be determined once lab results are received ROV prn if symptoms persist or worsen.

## 2021-02-04 LAB — CERVICOVAGINAL ANCILLARY ONLY
Bacterial Vaginitis (gardnerella): POSITIVE — AB
Candida Glabrata: NEGATIVE
Candida Vaginitis: NEGATIVE
Chlamydia: NEGATIVE
Comment: NEGATIVE
Comment: NEGATIVE
Comment: NEGATIVE
Comment: NEGATIVE
Comment: NEGATIVE
Comment: NORMAL
Neisseria Gonorrhea: NEGATIVE
Trichomonas: NEGATIVE

## 2021-02-11 ENCOUNTER — Other Ambulatory Visit: Payer: Self-pay | Admitting: Obstetrics & Gynecology

## 2021-02-11 DIAGNOSIS — N898 Other specified noninflammatory disorders of vagina: Secondary | ICD-10-CM

## 2021-02-11 MED ORDER — METRONIDAZOLE 500 MG PO TABS: 500.0000 mg | ORAL_TABLET | Freq: Two times a day (BID) | ORAL | 0 refills | Status: AC

## 2021-04-15 ENCOUNTER — Ambulatory Visit: Payer: 59 | Admitting: Adult Health

## 2021-05-04 ENCOUNTER — Ambulatory Visit: Payer: 59 | Admitting: Obstetrics

## 2021-05-12 ENCOUNTER — Other Ambulatory Visit (HOSPITAL_COMMUNITY)
Admission: RE | Admit: 2021-05-12 | Discharge: 2021-05-12 | Disposition: A | Discharge status: Home / Self Care | Payer: 59 | Source: Ambulatory Visit | Attending: Obstetrics and Gynecology | Admitting: Obstetrics and Gynecology

## 2021-05-12 ENCOUNTER — Ambulatory Visit: Payer: 59

## 2021-05-12 ENCOUNTER — Other Ambulatory Visit: Payer: Self-pay

## 2021-05-12 VITALS — BP 117/74 | HR 82

## 2021-05-12 DIAGNOSIS — Z202 Contact with and (suspected) exposure to infections with a predominantly sexual mode of transmission: Secondary | ICD-10-CM | POA: Diagnosis present

## 2021-05-12 DIAGNOSIS — N898 Other specified noninflammatory disorders of vagina: Secondary | ICD-10-CM | POA: Insufficient documentation

## 2021-05-12 NOTE — Progress Notes (Signed)
SUBJECTIVE:  30 y.o. female complains of vaginal discharge and now has a bump. She is concern about being expose to herpes and requested to be check. I have advised patient if results come back positive we have no way of know when to came in contact with the virus. Patient verbalizes understanding and would like to proceed with blood work. Denies abnormal vaginal bleeding or significant pelvic pain or fever. No UTI symptoms. Denies history of known exposure to STD.  No LMP recorded.  OBJECTIVE:  She appears well, afebrile. Urine dipstick: not done.  ASSESSMENT:  Vaginal Discharge small amount with bump Vaginal Odor: small amount    PLAN:  GC, chlamydia, trichomonas, BVAG, CVAG. HIV, RPR and HSV blood work,  probe sent to lab. Treatment: To be determined once lab results are received ROV prn if symptoms persist or worsen.

## 2021-05-13 LAB — CERVICOVAGINAL ANCILLARY ONLY
Bacterial Vaginitis (gardnerella): NEGATIVE
Candida Glabrata: NEGATIVE
Candida Vaginitis: NEGATIVE
Chlamydia: NEGATIVE
Comment: NEGATIVE
Comment: NEGATIVE
Comment: NEGATIVE
Comment: NEGATIVE
Comment: NEGATIVE
Comment: NORMAL
Neisseria Gonorrhea: NEGATIVE
Trichomonas: NEGATIVE

## 2021-05-14 ENCOUNTER — Other Ambulatory Visit: Payer: Self-pay

## 2021-05-14 ENCOUNTER — Telehealth: Payer: Self-pay

## 2021-05-14 DIAGNOSIS — B009 Herpesviral infection, unspecified: Secondary | ICD-10-CM

## 2021-05-14 LAB — HIV ANTIBODY (ROUTINE TESTING W REFLEX): HIV Screen 4th Generation wRfx: NONREACTIVE

## 2021-05-14 LAB — RPR: RPR Ser Ql: NONREACTIVE

## 2021-05-14 LAB — HSV(HERPES SMPLX)ABS-I+II(IGG+IGM)-BLD
HSV 1 Glycoprotein G Ab, IgG: 4.5 index — ABNORMAL HIGH (ref 0.00–0.90)
HSV 2 IgG, Type Spec: <0.91 index (ref 0.00–0.90)
HSVI/II Comb IgM: 1.51 Ratio — ABNORMAL HIGH (ref 0.00–0.90)

## 2021-05-14 MED ORDER — VALACYCLOVIR HCL 1 G PO TABS: 1000.0000 mg | ORAL_TABLET | Freq: Two times a day (BID) | ORAL | 99 refills | Status: DC

## 2021-05-14 NOTE — Telephone Encounter (Signed)
Pt needs Valtrex sent to [facility] on [address] instead of what is listed on file.

## 2021-05-14 NOTE — Progress Notes (Signed)
Ok to send Rx for HSV per Dr.Constant. Pt notified of results and awaiting tx.

## 2021-05-18 ENCOUNTER — Ambulatory Visit: Payer: 59 | Admitting: Advanced Practice Midwife

## 2021-05-18 NOTE — Progress Notes (Deleted)
   GYNECOLOGY PROGRESS NOTE  History:  30 y.o. G1P1001 presents to [facility] *** office today for problem gyn visit. She reports *****.  She denies h/a, dizziness, shortness of breath, n/v, or fever/chills.    The following portions of the patient's history were reviewed and updated as appropriate: allergies, current medications, past family history, past medical history, past social history, past surgical history and problem list. Last pap smear on *** was normal, *** HRHPV.  Health Maintenance Due  Topic Date Due   Hepatitis C Screening  Never done   COVID-19 Vaccine (3 - Booster for Moderna series) 08/13/2020   INFLUENZA VACCINE  04/27/2021     Review of Systems:  Pertinent items are noted in HPI.   Objective:  Physical Exam currently breastfeeding. VS reviewed, nursing note reviewed,  Constitutional: well developed, well nourished, no distress HEENT: normocephalic CV: normal rate Pulm/chest wall: normal effort Breast Exam: deferred Abdomen: soft Neuro: alert and oriented x 3 Skin: warm, dry Psych: affect normal Pelvic exam: Cervix pink, visually closed, without lesion, scant white creamy discharge, vaginal walls and external genitalia normal Bimanual exam: Cervix 0/long/high, firm, anterior, neg CMT, uterus nontender, nonenlarged, adnexa without tenderness, enlargement, or mass  Assessment & Plan:  There are no diagnoses linked to this encounter.  Sharen Counter, CNM 8:28 AM

## 2021-05-29 ENCOUNTER — Ambulatory Visit: Payer: 59 | Admitting: Family Medicine

## 2021-06-11 ENCOUNTER — Ambulatory Visit: Payer: 59 | Admitting: Adult Health

## 2021-06-11 NOTE — Progress Notes (Signed)
Patient no show appointment. ? ?

## 2021-07-13 ENCOUNTER — Other Ambulatory Visit: Payer: Self-pay | Admitting: Adult Health

## 2021-07-13 DIAGNOSIS — F331 Major depressive disorder, recurrent, moderate: Secondary | ICD-10-CM

## 2021-07-13 DIAGNOSIS — F411 Generalized anxiety disorder: Secondary | ICD-10-CM

## 2021-07-14 NOTE — Telephone Encounter (Signed)
Please schedule appt

## 2021-07-28 NOTE — Telephone Encounter (Signed)
Last seen 4/20 n/s on 9/14

## 2021-07-28 NOTE — Telephone Encounter (Signed)
Called and LVM

## 2021-07-30 ENCOUNTER — Encounter: Payer: Self-pay | Admitting: Adult Health

## 2021-07-30 ENCOUNTER — Ambulatory Visit (INDEPENDENT_AMBULATORY_CARE_PROVIDER_SITE_OTHER): Payer: 59 | Admitting: Adult Health

## 2021-07-30 ENCOUNTER — Other Ambulatory Visit: Payer: Self-pay

## 2021-07-30 DIAGNOSIS — F331 Major depressive disorder, recurrent, moderate: Secondary | ICD-10-CM

## 2021-07-30 DIAGNOSIS — F411 Generalized anxiety disorder: Secondary | ICD-10-CM

## 2021-07-30 DIAGNOSIS — F909 Attention-deficit hyperactivity disorder, unspecified type: Secondary | ICD-10-CM

## 2021-07-30 MED ORDER — SERTRALINE HCL 100 MG PO TABS: 100.0000 mg | ORAL_TABLET | Freq: Every day | ORAL | 5 refills | Status: DC

## 2021-07-30 MED ORDER — AMPHETAMINE-DEXTROAMPHET ER 20 MG PO CP24: 20.0000 mg | ORAL_CAPSULE | Freq: Two times a day (BID) | ORAL | 0 refills | Status: DC

## 2021-07-30 MED ORDER — AMPHETAMINE-DEXTROAMPHET ER 20 MG PO CP24
20.0000 mg | ORAL_CAPSULE | Freq: Two times a day (BID) | ORAL | 0 refills | Status: DC
Start: 2021-09-24 — End: 2022-04-16

## 2021-07-30 NOTE — Progress Notes (Signed)
Kim Stanton [id number] 1990/10/19 30 y.o.  Subjective:   Patient ID:  Kim Stanton is a 29 y.o. (DOB [birth year]) female.  Chief Complaint: No chief complaint on file.   HPI LAKELA KUBA presents to the office today for follow-up of MDD, GAD, and ADHD.   Describes mood today as "ok". Pleasant. Tearful at times. Mood symptoms - denies anxiety, depression and irritability.Mood remains level. Stating "I am doing ok". Has moved back in with mother and feels like she has more support. Is co-parenting with daughter's father. Feels like medications are working for her.Stable interest and motivation. Taking medications as prescribed.  Energy levels stable. Active, does not have a regular exercise routine.  Enjoys some usual interests and activities. Single. Lives with mother, sister and daughter, age 74. Spending time with family. Appetite adequate. Weight loss - 5 pounds - 173 from 178 pounds. Sleeps well most nights. Averages 7 hours. Focus and concentration difficulties after lunch. Completing tasks. Managing aspects of household. Works for [employer] full time - remote position. Plans to restart school in January. Denies SI or HI.  Denies AH or VH.  Previous medication trial: Adderall XR   Review of Systems:  Review of Systems  Musculoskeletal:  Negative for gait problem.  Neurological:  Negative for tremors.  Psychiatric/Behavioral:         Please refer to HPI   Medications: I have reviewed the patient's current medications.  Current Outpatient Medications  Medication Sig Dispense Refill   amphetamine-dextroamphetamine (ADDERALL XR) 20 MG 24 hr capsule Take 1 capsule (20 mg total) by mouth 2 (two) times daily. 60 capsule 0   [START ON 08/27/2021] amphetamine-dextroamphetamine (ADDERALL XR) 20 MG 24 hr capsule Take 1 capsule (20 mg total) by mouth 2 (two) times daily. 60 capsule 0   [START ON 09/24/2021] amphetamine-dextroamphetamine (ADDERALL XR) 20 MG 24 hr capsule Take 1  capsule (20 mg total) by mouth 2 (two) times daily. 60 capsule 0   cephALEXin (KEFLEX) 500 MG capsule Take 1 capsule (500 mg total) by mouth 4 (four) times daily. 28 capsule 0   cyclobenzaprine (FLEXERIL) 5 MG tablet TAKE 1-2 TABLETS (5-10 MG TOTAL) BY MOUTH 3 (THREE) TIMES DAILY AS NEEDED FOR MUSCLE SPASMS. 30 tablet 3   ibuprofen (ADVIL) 600 MG tablet Take 1 tablet (600 mg total) by mouth every 6 (six) hours as needed. 30 tablet 0   Prenatal Vit-Fe Fumarate-FA (PRENATAL VITAMINS PLUS PO) Take by mouth.     sertraline (ZOLOFT) 100 MG tablet Take 1 tablet (100 mg total) by mouth daily. 30 tablet 5   valACYclovir (VALTREX) 1000 MG tablet Take 1 tablet (1,000 mg total) by mouth 2 (two) times daily. Take for 5 days 30 tablet PRN   No current facility-administered medications for this visit.    Medication Side Effects: None  Allergies:  Allergies  Allergen Reactions   Lactose Intolerance (Gi) Nausea Only and Other (See Comments)    Upsets the stomach    Past Medical History:  Diagnosis Date   Encounter for supervision of normal pregnancy, antepartum 12/21/2018    Nursing Staff Provider Office Location  [facility] Dating   LMP 10/10/2018 Language   English Anatomy US   11/25/2018 Flu Vaccine   Declined 12/21/2018 Genetic Screen  NIPS:   AFP:   First Screen:  Quad:   TDaP vaccine    Hgb A1C or  GTT Early  Third trimester  Rhogam     LAB RESULTS  Feeding Plan  Breast  Blood Type --/--/AB POS Performed at [facility], 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   Please see review of systems for further details on the patient's review from today.   Objective:   Physical Exam:  There were no vitals taken for this visit.  Physical Exam Constitutional:       General: She is not in acute distress. Musculoskeletal:        General: No deformity.  Neurological:     Mental Status: She is alert and oriented to person, place, and time.     Coordination: Coordination normal.  Psychiatric:        Attention and Perception: Attention and perception normal. She does not perceive auditory or visual hallucinations.        Mood and Affect: Mood normal. Mood is not anxious or depressed. Affect is not labile, blunt, angry or inappropriate.        Speech: Speech normal.        Behavior: Behavior normal.        Thought Content: Thought content normal. Thought content is not paranoid or delusional. Thought content does not include homicidal or suicidal ideation. Thought content does not include homicidal or suicidal plan.        Cognition and Memory: Cognition and memory normal.        Judgment: Judgment normal.     Comments: Insight intact    Lab Review:     Component Value Date/Time   NA 134 (L) 07/16/2019 1950   NA 137 06/15/2019 1047   K 4.0 07/16/2019 1950   CL 104 07/16/2019 1950   CO2 19 (L) 07/16/2019 1950   GLUCOSE 91 07/16/2019 1950   BUN 14 07/16/2019 1950   BUN 5 (L) 06/15/2019 1047   CREATININE 0.69 07/16/2019 1950   CALCIUM 9.4 07/16/2019 1950   PROT 6.5 07/16/2019 1950   PROT 6.3 06/15/2019 1047   ALBUMIN 3.4 (L) 07/16/2019 1950   ALBUMIN 3.9 06/15/2019 1047   AST 25 07/16/2019 1950   ALT 13 07/16/2019 1950   ALKPHOS 196 (H) 07/16/2019 1950   BILITOT 0.4 07/16/2019 1950   BILITOT <0.2 06/15/2019 1047   GFRNONAA >60 07/16/2019 1950   GFRAA >60 07/16/2019 1950       Component Value Date/Time   WBC 7.0 07/16/2019 1923   RBC 4.27 07/16/2019 1923   HGB 12.8 07/16/2019 1923   HGB 11.4 05/02/2019 1015   HCT 38.4 07/16/2019 1923   HCT 34.7 05/02/2019 1015   PLT 333 07/16/2019 1923   PLT 288 05/02/2019 1015   MCV 89.9 07/16/2019 1923   MCV 91 05/02/2019 1015   MCH 30.0 07/16/2019 1923   MCHC 33.3 07/16/2019 1923   RDW 14.6  07/16/2019 1923   RDW 13.2 05/02/2019 1015   LYMPHSABS 2.7 12/21/2018 1133   MONOABS 0.5 05/24/2018 1249   EOSABS 0.1 12/21/2018 1133   BASOSABS 0.0 12/21/2018 1133    No results found for: POCLITH, LITHIUM   No results found for: PHENYTOIN, PHENOBARB, VALPROATE, CBMZ   .res Assessment: Plan:    Plan:  PDMP reviewed  1. Adderall XR 20mg  daily to BID 2. Zoloft 100mg  daily  109/80/90  Read and reviewed note with patient for accuracy.   RTC 4 weeks  Patient advised to contact office with any questions, adverse effects, or acute worsening in signs and symptoms.  Discussed potential benefits, risks, and side effects of stimulants with patient to include increased heart rate, palpitations, insomnia, increased anxiety, increased irritability, or decreased appetite.  Instructed patient to contact office if experiencing any significant tolerability issues.  Diagnoses and all orders for this visit:  Generalized anxiety disorder -     sertraline (ZOLOFT) 100 MG tablet; Take 1 tablet (100 mg total) by mouth daily.  Major depressive disorder, recurrent episode, moderate (HCC) -     sertraline (ZOLOFT) 100 MG tablet; Take 1 tablet (100 mg total) by mouth daily.  Attention deficit hyperactivity disorder (ADHD), unspecified ADHD type -     amphetamine-dextroamphetamine (ADDERALL XR) 20 MG 24 hr capsule; Take 1 capsule (20 mg total) by mouth 2 (two) times daily. -     amphetamine-dextroamphetamine (ADDERALL XR) 20 MG 24 hr capsule; Take 1 capsule (20 mg total) by mouth 2 (two) times daily. -     amphetamine-dextroamphetamine (ADDERALL XR) 20 MG 24 hr capsule; Take 1 capsule (20 mg total) by mouth 2 (two) times daily.    Please see After Visit Summary for patient specific instructions.  No future appointments.  No orders of the defined types were placed in this encounter.   -------------------------------

## 2021-08-27 ENCOUNTER — Ambulatory Visit
Admission: RE | Admit: 2021-08-27 | Discharge: 2021-08-27 | Disposition: A | Discharge status: Home / Self Care | Payer: 59 | Source: Ambulatory Visit | Attending: Urgent Care | Admitting: Urgent Care

## 2021-08-27 ENCOUNTER — Other Ambulatory Visit: Payer: Self-pay

## 2021-08-27 VITALS — BP 109/76 | HR 96 | Temp 98.0°F | Resp 20

## 2021-08-27 DIAGNOSIS — K120 Recurrent oral aphthae: Secondary | ICD-10-CM | POA: Diagnosis not present

## 2021-08-27 DIAGNOSIS — B349 Viral infection, unspecified: Secondary | ICD-10-CM | POA: Insufficient documentation

## 2021-08-27 DIAGNOSIS — R52 Pain, unspecified: Secondary | ICD-10-CM | POA: Diagnosis present

## 2021-08-27 DIAGNOSIS — R070 Pain in throat: Secondary | ICD-10-CM | POA: Diagnosis present

## 2021-08-27 LAB — POCT RAPID STREP A (OFFICE): Rapid Strep A Screen: NEGATIVE

## 2021-08-27 MED ORDER — PSEUDOEPHEDRINE HCL 30 MG PO TABS: 30.0000 mg | ORAL_TABLET | Freq: Three times a day (TID) | ORAL | 0 refills | Status: DC | PRN

## 2021-08-27 MED ORDER — CETIRIZINE HCL 10 MG PO TABS: 10.0000 mg | ORAL_TABLET | Freq: Every day | ORAL | 0 refills | Status: DC

## 2021-08-27 NOTE — ED Triage Notes (Signed)
Pt presents with c/o sore throat and diarrhea for past week, concerned for strep

## 2021-08-27 NOTE — Discharge Instructions (Addendum)
We will notify you of your test results as they arrive and may take between 48-72 hours.  I encourage you to sign up for MyChart if you have not already done so as this can be the easiest way for [place] to communicate results to you online or through a phone app.  Generally, we only contact you if it is a positive test result.  In the meantime, if you develop worsening symptoms including fever, chest pain, shortness of breath despite our current treatment plan then please report to the emergency room as this may be a sign of worsening status from possible viral infection.  Otherwise, we will manage this as a viral syndrome. For sore throat or cough try using a honey-based tea. Use 3 teaspoons of honey with juice squeezed from half lemon. Place shaved pieces of ginger into 1/2-1 cup of water and warm over stove top. Then mix the ingredients and repeat every 4 hours as needed. Please take Tylenol 500mg -650mg  every 6 hours for aches and pains, fevers. Hydrate very well with at least 2 liters of water. Eat light meals such as soups to replenish electrolytes and soft fruits, veggies. Start an antihistamine like Zyrtec for postnasal drainage, sinus congestion.  You can take this together with pseudoephedrine (Sudafed) at a dose of 30mg  -60mg  2-3 times a day as needed for the same kind of congestion.

## 2021-08-27 NOTE — ED Provider Notes (Signed)
[facility]   MRN: [id number] DOB: [birth year]  Subjective:   Kim Stanton is a 30 y.o. female presenting for 5-6 day history of acute onset persistent throat pain, and white spots on her tonsils.  Has also had achiness, would like to have an exam of her top right gumlines.  Reports that there was a very sore spot there that she is worried about an abscess. No cough, chest pain, shob, wheezing. She is not breastfeeding.  Has been taking  No current facility-administered medications for this encounter.  Current Outpatient Medications:    amphetamine-dextroamphetamine (ADDERALL XR) 20 MG 24 hr capsule, Take 1 capsule (20 mg total) by mouth 2 (two) times daily., Disp: 60 capsule, Rfl: 0   amphetamine-dextroamphetamine (ADDERALL XR) 20 MG 24 hr capsule, Take 1 capsule (20 mg total) by mouth 2 (two) times daily., Disp: 60 capsule, Rfl: 0   [START ON 09/24/2021] amphetamine-dextroamphetamine (ADDERALL XR) 20 MG 24 hr capsule, Take 1 capsule (20 mg total) by mouth 2 (two) times daily., Disp: 60 capsule, Rfl: 0   cephALEXin (KEFLEX) 500 MG capsule, Take 1 capsule (500 mg total) by mouth 4 (four) times daily., Disp: 28 capsule, Rfl: 0   cyclobenzaprine (FLEXERIL) 5 MG tablet, TAKE 1-2 TABLETS (5-10 MG TOTAL) BY MOUTH 3 (THREE) TIMES DAILY AS NEEDED FOR MUSCLE SPASMS., Disp: 30 tablet, Rfl: 3   ibuprofen (ADVIL) 600 MG tablet, Take 1 tablet (600 mg total) by mouth every 6 (six) hours as needed., Disp: 30 tablet, Rfl: 0   Prenatal Vit-Fe Fumarate-FA (PRENATAL VITAMINS PLUS PO), Take by mouth., Disp: , Rfl:    sertraline (ZOLOFT) 100 MG tablet, Take 1 tablet (100 mg total) by mouth daily., Disp: 30 tablet, Rfl: 5   valACYclovir (VALTREX) 1000 MG tablet, Take 1 tablet (1,000 mg total) by mouth 2 (two) times daily. Take for 5 days, Disp: 30 tablet, Rfl: PRN   Allergies  Allergen Reactions   Lactose Intolerance (Gi) Nausea Only and Other (See Comments)    Upsets the stomach    Past  Medical History:  Diagnosis Date   Encounter for supervision of normal pregnancy, antepartum 12/21/2018    Nursing Staff Provider Office Location  [facility] Dating   LMP 10/10/2018 Language   English Anatomy US   11/25/2018 Flu Vaccine   Declined 12/21/2018 Genetic Screen  NIPS:   AFP:   First Screen:  Quad:   TDaP vaccine    Hgb A1C or  GTT Early  Third trimester  Rhogam     LAB RESULTS  Feeding Plan  Breast Blood Type --/--/AB POS Performed at [facility], 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   Medical history non-contributory      Past Surgical History:  Procedure Laterality Date   NO PAST SURGERIES     WISDOM TOOTH EXTRACTION      Family History  Problem Relation Age of Onset   Cancer Maternal Grandmother        Breast   Diabetes Maternal Grandmother     Social History   Tobacco Use   Smoking status: Former    Types: Cigarettes    Quit date: 11/09/2018    Years since quitting: 2.8   Smokeless tobacco: Never  Vaping Use   Vaping Use: Former  Substance Use Topics   Alcohol use: Not Currently    Alcohol/week: 2.0 standard drinks    Types: 2  Shots of liquor per week    Comment: often WEEKENDS (05/30/18)   Drug use: Never    ROS   Objective:   Vitals: BP 109/76   Pulse 96   Temp 98 F (36.7 C)   Resp 20   SpO2 97%   Physical Exam Constitutional:      General: She is not in acute distress.    Appearance: Normal appearance. She is well-developed. She is not ill-appearing, toxic-appearing or diaphoretic.  HENT:     Head: Normocephalic and atraumatic.     Comments: Resolving aphthous ulcer over the right upper anterior-lateral gumline.    Nose: Nose normal.     Mouth/Throat:     Mouth: Mucous membranes are moist.     Pharynx: No pharyngeal swelling, oropharyngeal exudate, posterior oropharyngeal erythema or uvula swelling.     Tonsils: No tonsillar exudate or tonsillar abscesses. 0  on the right. 0 on the left.  Eyes:     General: No scleral icterus.    Extraocular Movements: Extraocular movements intact.     Pupils: Pupils are equal, round, and reactive to light.  Cardiovascular:     Rate and Rhythm: Normal rate and regular rhythm.     Pulses: Normal pulses.     Heart sounds: Normal heart sounds. No murmur heard.   No friction rub. No gallop.  Pulmonary:     Effort: Pulmonary effort is normal. No respiratory distress.     Breath sounds: Normal breath sounds. No stridor. No wheezing, rhonchi or rales.  Skin:    General: Skin is warm and dry.     Findings: No rash.  Neurological:     General: No focal deficit present.     Mental Status: She is alert and oriented to person, place, and time.  Psychiatric:        Mood and Affect: Mood normal.        Behavior: Behavior normal.        Thought Content: Thought content normal.    Results for orders placed or performed during the hospital encounter of 08/27/21 (from the past 24 hour(s))  POCT rapid strep A     Status: None   Collection Time: 08/27/21  4:25 PM  Result Value Ref Range   Rapid Strep A Screen Negative Negative    Assessment and Plan :   PDMP not reviewed this encounter.  1. Acute viral syndrome   2. Throat pain   3. Body aches   4. Aphthous ulcer    Strep culture, COVID and flu test pending.  We will otherwise manage for viral upper respiratory infection.  Physical exam findings reassuring and vital signs stable for discharge. Advised supportive care, offered symptomatic relief.  General management of aphthous ulcer reviewed.  Counseled patient on potential for adverse effects with medications prescribed/recommended today, ER and return-to-clinic precautions discussed, patient verbalized understanding.      Wallis Bamberg, [place] 08/27/21 1635

## 2021-08-28 LAB — COVID-19, FLU A+B NAA
Influenza A, NAA: NOT DETECTED
Influenza B, NAA: NOT DETECTED
SARS-CoV-2, NAA: DETECTED — AB

## 2021-08-30 LAB — CULTURE, GROUP A STREP (THRC)

## 2021-09-11 ENCOUNTER — Telehealth: Payer: 59 | Admitting: Physician Assistant

## 2021-09-11 DIAGNOSIS — U071 COVID-19: Secondary | ICD-10-CM

## 2021-09-11 NOTE — Progress Notes (Signed)
E-Visit  for Positive Covid Test Result  We are sorry you are not feeling well. We are here to help!  You have tested positive for COVID-19, meaning that you were infected with the novel coronavirus and could give the virus to others.  It is vitally important that you stay home so you do not spread it to others.      Please continue isolation at home, for at least 10 days since the start of your symptoms and until you have had 24 hours with no fever (without taking a fever reducer) and with improving of symptoms.  If you have no symptoms but tested positive (or all symptoms resolve after 5 days and you have no fever) you can leave your house but continue to wear a mask around others for an additional 5 days. If you have a fever,continue to stay home until you have had 24 hours of no fever. Most cases improve 5-10 days from onset but we have seen a small number of patients who have gotten worse after the 10 days.  Please be sure to watch for worsening symptoms and remain taking the proper precautions.   Go to the nearest hospital ED for assessment if fever/cough/breathlessness are severe or illness seems like a threat to life.    The following symptoms may appear 2-14 days after exposure: Fever Cough Shortness of breath or difficulty breathing Chills Repeated shaking with chills Muscle pain Headache Sore throat New loss of taste or smell Fatigue Congestion or runny nose Nausea or vomiting Diarrhea  You have been enrolled in MyChart Home Monitoring for COVID-19. Daily you will receive a questionnaire within the MyChart website. Our COVID-19 response team will be monitoring your responses daily.  You can use medication such as chloraseptic spray OTC as needed for sore throat.  You may also take acetaminophen (Tylenol) as needed for fever.  HOME CARE: Only take medications as instructed by your medical team. Drink plenty of fluids and get plenty of rest. A steam or ultrasonic  humidifier can help if you have congestion.   GET HELP RIGHT AWAY IF YOU HAVE EMERGENCY WARNING SIGNS.  Call 911 or proceed to your closest emergency facility if: You develop worsening high fever. Trouble breathing Bluish lips or face Persistent pain or pressure in the chest New confusion Inability to wake or stay awake You cough up blood. Your symptoms become more severe Inability to hold down food or fluids  This list is not all possible symptoms. Contact your medical provider for any symptoms that are severe or concerning to you.    Your e-visit answers were reviewed by a board certified advanced clinical practitioner to complete your personal care plan.  Depending on the condition, your plan could have included both over the counter or prescription medications.  If there is a problem please reply once you have received a response from your provider.  Your safety is important to [place].  If you have drug allergies check your prescription carefully.    You can use MyChart to ask questions about today's visit, request a non-urgent call back, or ask for a work or school excuse for 24 hours related to this e-Visit. If it has been greater than 24 hours you will need to follow up with your provider, or enter a new e-Visit to address those concerns. You will get an e-mail in the next two days asking about your experience.  I hope that your e-visit has been valuable and will speed  your recovery. Thank you for using e-visits.  I provided 5 minutes of non face-to-face time during this encounter for chart review and documentation.

## 2021-09-21 ENCOUNTER — Telehealth: Payer: 59 | Admitting: Physician Assistant

## 2021-09-21 DIAGNOSIS — U071 COVID-19: Secondary | ICD-10-CM

## 2021-09-21 NOTE — Progress Notes (Signed)
Based on what you shared with me, I feel your condition warrants further evaluation and I recommend that you be seen in a face to face visit.  If you are still experiencing symptoms 10 days after a positive test and since 08/28/21, you MUST be evaluated in person to make sure nothing else, like pneumonia, is going on for you.    NOTE: There will be NO CHARGE for this eVisit   If you are having a true medical emergency please call 911.      For an urgent face to face visit, [facility] has six urgent care centers for your convenience:     [facility] at [facility] Directions [phone] [address] [place], [place] [address]    [facility]) Get Driving Directions [phone] [address] [place], [place] [address]  [facility]) Get Driving Directions [phone] [address] [place],  [place]  [address]  [facility] at [name] Get Driving Directions [phone] [address] [place], [place] [address]   [facility] at [facility] Get Driving Directions  [phone] [address] [place], [place] [address]   [facility] at [facility] Directions [phone] [address] [place], [place] [address]  Your MyChart E-visit questionnaire answers were reviewed by a board certified advanced clinical practitioner to complete your personal care plan based on your specific symptoms.  Thank you for using e-Visits.   I provided 5 minutes of non face-to-face time during this encounter for chart review and documentation.

## 2021-09-22 ENCOUNTER — Other Ambulatory Visit: Payer: Self-pay | Admitting: Adult Health

## 2021-09-22 ENCOUNTER — Other Ambulatory Visit: Payer: Self-pay

## 2021-09-22 ENCOUNTER — Ambulatory Visit
Admission: EM | Admit: 2021-09-22 | Discharge: 2021-09-22 | Disposition: A | Discharge status: Home / Self Care | Payer: 59 | Attending: Internal Medicine | Admitting: Internal Medicine

## 2021-09-22 ENCOUNTER — Ambulatory Visit: Admit: 2021-09-22 | Discharge status: Absent without leave | Payer: 59

## 2021-09-22 ENCOUNTER — Ambulatory Visit (INDEPENDENT_AMBULATORY_CARE_PROVIDER_SITE_OTHER): Payer: 59

## 2021-09-22 DIAGNOSIS — F331 Major depressive disorder, recurrent, moderate: Secondary | ICD-10-CM

## 2021-09-22 DIAGNOSIS — R059 Cough, unspecified: Secondary | ICD-10-CM | POA: Diagnosis not present

## 2021-09-22 DIAGNOSIS — R053 Chronic cough: Secondary | ICD-10-CM | POA: Diagnosis not present

## 2021-09-22 DIAGNOSIS — J029 Acute pharyngitis, unspecified: Secondary | ICD-10-CM | POA: Diagnosis present

## 2021-09-22 DIAGNOSIS — F411 Generalized anxiety disorder: Secondary | ICD-10-CM

## 2021-09-22 LAB — POCT RAPID STREP A (OFFICE): Rapid Strep A Screen: NEGATIVE

## 2021-09-22 MED ORDER — PREDNISONE 20 MG PO TABS: 40.0000 mg | ORAL_TABLET | Freq: Every day | ORAL | 0 refills | Status: AC

## 2021-09-22 MED ORDER — BENZONATATE 100 MG PO CAPS: 100.0000 mg | ORAL_CAPSULE | Freq: Three times a day (TID) | ORAL | 0 refills | Status: DC | PRN

## 2021-09-22 NOTE — ED Triage Notes (Signed)
Patient presents to Urgent Care for follow-up from 12/01 visit. She tested positive for covid continues to have diarrhea, cough, and sore throat. She states she is concerned with pneumonia.    Denies fever, SOB.

## 2021-09-22 NOTE — Discharge Instructions (Signed)
Your chest x-ray was normal.  You have been prescribed prednisone steroid and cough medication to help alleviate symptoms.  Rapid strep is negative.  Throat culture is pending.  We will call if it is positive.

## 2021-09-22 NOTE — ED Provider Notes (Signed)
[name]    CSN: [id number] Arrival date & time: 09/22/21  1541      History   Chief Complaint Chief Complaint  Patient presents with   Follow-up    Covid positive     HPI Kim Stanton is a 30 y.o. female.   Patient presents with further evaluation for persistent COVID-19 symptoms.  Patient tested positive for COVID-19 on 08/27/2021.  She has had persistent diarrhea, productive cough, sore throat.  Cough is productive with yellow to green sputum.  Patient denies any known fevers but reports that she has felt feverish at times.  Denies chest pain, shortness of breath, nausea, vomiting, abdominal pain.  Has taken over-the-counter cough and cold medication with minimal improvement in symptoms.    Past Medical History:  Diagnosis Date   Encounter for supervision of normal pregnancy, antepartum 12/21/2018    Nursing Staff Provider Office Location  [facility] Dating   LMP 10/10/2018 Language   English Anatomy US   11/25/2018 Flu Vaccine   Declined 12/21/2018 Genetic Screen  NIPS:   AFP:   First Screen:  Quad:   TDaP vaccine    Hgb A1C or  GTT Early  Third trimester  Rhogam     LAB RESULTS  Feeding Plan  Breast Blood Type --/--/AB POS Performed at [facility], 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 Authorizing Provider  benzonatate (TESSALON) 100 MG capsule Take 1 capsule  (100 mg total) by mouth every 8 (eight) hours as needed for cough. 09/22/21  Yes Tuwanna Krausz, Rolly Salter E, FNP  predniSONE (DELTASONE) 20 MG tablet Take 2 tablets (40 mg total) by mouth daily for 5 days. 09/22/21 09/27/21 Yes Ladene Allocca, Acie Fredrickson, FNP  amphetamine-dextroamphetamine (ADDERALL XR) 20 MG 24 hr capsule Take 1 capsule (20 mg total) by mouth 2 (two) times daily. 07/30/21   Mozingo, Thereasa Solo, NP  amphetamine-dextroamphetamine (ADDERALL XR) 20 MG 24 hr capsule Take 1 capsule (20 mg total) by mouth 2 (two) times daily. 08/27/21   Mozingo, Thereasa Solo, NP  amphetamine-dextroamphetamine (ADDERALL XR) 20 MG 24 hr capsule Take 1 capsule (20 mg total) by mouth 2 (two) times daily. 09/24/21   Mozingo, Thereasa Solo, NP  cephALEXin (KEFLEX) 500 MG capsule Take 1 capsule (500 mg total) by mouth 4 (four) times daily. 11/18/19   Judeth Horn, NP  cetirizine (ZYRTEC ALLERGY) 10 MG tablet Take 1 tablet (10 mg total) by mouth daily. 08/27/21   Wallis Bamberg, PA-C  cyclobenzaprine (FLEXERIL) 5 MG tablet TAKE 1-2 TABLETS (5-10 MG TOTAL) BY MOUTH  3 (THREE) TIMES DAILY AS NEEDED FOR MUSCLE SPASMS. 05/30/20   Cristie Hem, PA-C  ibuprofen (ADVIL) 600 MG tablet Take 1 tablet (600 mg total) by mouth every 6 (six) hours as needed. 11/18/19   Judeth Horn, NP  Prenatal Vit-Fe Fumarate-FA (PRENATAL VITAMINS PLUS PO) Take by mouth.    [provider]  pseudoephedrine (SUDAFED) 30 MG tablet Take 1 tablet (30 mg total) by mouth every 8 (eight) hours as needed for congestion. 08/27/21   Wallis Bamberg, PA-C  sertraline (ZOLOFT) 100 MG tablet Take 1 tablet (100 mg total) by mouth daily. 07/30/21 01/26/22  Mozingo, Thereasa Solo, NP  valACYclovir (VALTREX) 1000 MG tablet Take 1 tablet (1,000 mg total) by mouth 2 (two) times daily. Take for 5 days 05/14/21   Constant, Peggy, MD    Family History Family History  Problem Relation Age of Onset   Cancer Maternal Grandmother        Breast   Diabetes Maternal Grandmother      Social History Social History   Tobacco Use   Smoking status: Former    Types: Cigarettes    Quit date: 11/09/2018    Years since quitting: 2.8   Smokeless tobacco: Never  Vaping Use   Vaping Use: Former  Substance Use Topics   Alcohol use: Not Currently    Alcohol/week: 2.0 standard drinks    Types: 2 Shots of liquor per week    Comment: often WEEKENDS (05/30/18)   Drug use: Never     Allergies   Lactose intolerance (gi)   Review of Systems Review of Systems Per HPI  Physical Exam Triage Vital Signs ED Triage Vitals  Enc Vitals Group     BP 09/22/21 1621 (!) 144/84     Pulse Rate 09/22/21 1621 95     Resp 09/22/21 1621 16     Temp 09/22/21 1621 98 F (36.7 C)     Temp Source 09/22/21 1621 Oral     SpO2 09/22/21 1621 98 %     Weight --      Height --      Head Circumference --      Peak Flow --      Pain Score 09/22/21 1620 0     Pain Loc --      Pain Edu? --      Excl. in GC? --    No data found.  Updated Vital Signs BP (!) 144/84 (BP Location: Left Arm)    Pulse 95    Temp 98 F (36.7 C) (Oral)    Resp 16    LMP 09/08/2021 (Approximate)    SpO2 98%   Visual Acuity Right Eye Distance:   Left Eye Distance:   Bilateral Distance:    Right Eye Near:   Left Eye Near:    Bilateral Near:     Physical Exam Constitutional:      General: She is not in acute distress.    Appearance: Normal appearance. She is not toxic-appearing or diaphoretic.  HENT:     Head: Normocephalic and atraumatic.     Right Ear: Tympanic membrane and ear canal normal.     Left Ear: Tympanic membrane and ear canal normal.     Nose: Congestion present.     Mouth/Throat:     Mouth: Mucous membranes are moist.     Pharynx: Posterior oropharyngeal erythema present.  Eyes:     Extraocular Movements: Extraocular movements intact.     Conjunctiva/sclera: Conjunctivae normal.  Pupils: Pupils are equal, round, and reactive to light.  Cardiovascular:     Rate and Rhythm:  Normal rate and regular rhythm.     Pulses: Normal pulses.     Heart sounds: Normal heart sounds.  Pulmonary:     Effort: Pulmonary effort is normal. No respiratory distress.     Breath sounds: Normal breath sounds. No stridor. No wheezing, rhonchi or rales.  Abdominal:     General: Abdomen is flat. Bowel sounds are normal.     Palpations: Abdomen is soft.  Musculoskeletal:        General: Normal range of motion.     Cervical back: Normal range of motion.  Skin:    General: Skin is warm and dry.  Neurological:     General: No focal deficit present.     Mental Status: She is alert and oriented to person, place, and time. Mental status is at baseline.  Psychiatric:        Mood and Affect: Mood normal.        Behavior: Behavior normal.     UC Treatments / Results  Labs (all labs ordered are listed, but only abnormal results are displayed) Labs Reviewed  CULTURE, GROUP A STREP [facility])  POCT RAPID STREP A (OFFICE)    EKG   Radiology DG Chest 2 View  Result Date: 09/22/2021 CLINICAL DATA:  Persistent cough EXAM: CHEST - 2 VIEW COMPARISON:  December 27, 2012 FINDINGS: The heart size and mediastinal contours are within normal limits. No focal consolidation. No pleural effusion. No pneumothorax. The visualized skeletal structures are unremarkable. IMPRESSION: No active cardiopulmonary disease. Electronically Signed   By: Maudry Mayhew M.D.   On: 09/22/2021 17:11    Procedures Procedures (including critical care time)  Medications Ordered in UC Medications - No data to display  Initial Impression / Assessment and Plan / UC Course  I have reviewed the triage vital signs and the nursing notes.  Pertinent labs & imaging results that were available during my care of the patient were reviewed by me and considered in my medical decision making (see chart for details).     Patient appears to be having persistent COVID-19 symptoms.  Rapid strep was completed in urgent care due to  persistent sore throat.  It was negative.  Throat culture is pending.  Chest x-ray completed showing no acute cardiopulmonary process.  Will treat with prednisone x5 days to decrease inflammation.  Benzonatate prescribed to take as needed for cough.  No red flags on exam.  She is nontoxic-appearing.  No signs of respiratory compromise on exam.  Discussed strict return precautions.  Patient verbalized understanding and was agreeable with plan. Final Clinical Impressions(s) / UC Diagnoses   Final diagnoses:  Persistent cough for 3 weeks or longer  Sore throat     Discharge Instructions      Your chest x-ray was normal.  You have been prescribed prednisone steroid and cough medication to help alleviate symptoms.  Rapid strep is negative.  Throat culture is pending.  We will call if it is positive.     ED Prescriptions     Medication Sig Dispense Auth. Provider   predniSONE (DELTASONE) 20 MG tablet Take 2 tablets (40 mg total) by mouth daily for 5 days. 10 tablet [place], [place] E, [place]   benzonatate (TESSALON) 100 MG capsule Take 1 capsule (100 mg total) by mouth every 8 (eight) hours as needed for cough. 21 capsule [place], Acie Fredrickson, [place]  PDMP not reviewed this encounter.   Gustavus Bryant, [place] 09/22/21 [phone]

## 2021-09-23 ENCOUNTER — Telehealth: Payer: 59 | Admitting: Physician Assistant

## 2021-09-23 DIAGNOSIS — R058 Other specified cough: Secondary | ICD-10-CM

## 2021-09-23 NOTE — Progress Notes (Signed)
We are sorry that you are not feeling well.  Here is how we plan to help!  Based on your presentation I believe you most likely have post-viral cough syndrome.  Continue medications prescribed yesterday.  A work note has been provided and will be available in your MyChart under "letters".  From your responses in the eVisit questionnaire you describe inflammation in the upper respiratory tract which is causing a significant cough.  This is commonly called Bronchitis and has four common causes:   Allergies Viral Infections Acid Reflux Bacterial Infection Allergies, viruses and acid reflux are treated by controlling symptoms or eliminating the cause. An example might be a cough caused by taking certain blood pressure medications. You stop the cough by changing the medication. Another example might be a cough caused by acid reflux. Controlling the reflux helps control the cough.  USE OF BRONCHODILATOR ("RESCUE") INHALERS: There is a risk from using your bronchodilator too frequently.  The risk is that over-reliance on a medication which only relaxes the muscles surrounding the breathing tubes can reduce the effectiveness of medications prescribed to reduce swelling and congestion of the tubes themselves.  Although you feel brief relief from the bronchodilator inhaler, your asthma may actually be worsening with the tubes becoming more swollen and filled with mucus.  This can delay other crucial treatments, such as oral steroid medications. If you need to use a bronchodilator inhaler daily, several times per day, you should discuss this with your provider.  There are probably better treatments that could be used to keep your asthma under control.     HOME CARE Only take medications as instructed by your medical team. Complete the entire course of an antibiotic. Drink plenty of fluids and get plenty of rest. Avoid close contacts especially the very young and the elderly Cover your mouth if you cough  or cough into your sleeve. Always remember to wash your hands A steam or ultrasonic humidifier can help congestion.   GET HELP RIGHT AWAY IF: You develop worsening fever. You become short of breath You cough up blood. Your symptoms persist after you have completed your treatment plan MAKE SURE YOU  Understand these instructions. Will watch your condition. Will get help right away if you are not doing well or get worse.    Thank you for choosing an e-visit.  Your e-visit answers were reviewed by a board certified advanced clinical practitioner to complete your personal care plan. Depending upon the condition, your plan could have included both over the counter or prescription medications.  Please review your pharmacy choice. Make sure the pharmacy is open so you can pick up prescription now. If there is a problem, you may contact your provider through [employer] and have the prescription routed to another pharmacy.  Your safety is important to [place]. If you have drug allergies check your prescription carefully.   For the next 24 hours you can use MyChart to ask questions about today's visit, request a non-urgent call back, or ask for a work or school excuse. You will get an email in the next two days asking about your experience. I hope that your e-visit has been valuable and will speed your recovery.  I provided 5 minutes of non face-to-face time during this encounter for chart review and documentation.

## 2021-09-25 LAB — CULTURE, GROUP A STREP (THRC)

## 2021-10-28 ENCOUNTER — Ambulatory Visit: Payer: 59 | Admitting: Adult Health

## 2021-10-28 NOTE — Progress Notes (Signed)
Patient no show appointment. ? ?

## 2021-12-11 ENCOUNTER — Ambulatory Visit: Payer: 59

## 2021-12-13 ENCOUNTER — Ambulatory Visit (HOSPITAL_COMMUNITY): Payer: 59

## 2022-04-15 ENCOUNTER — Ambulatory Visit (INDEPENDENT_AMBULATORY_CARE_PROVIDER_SITE_OTHER): Payer: BLUE CROSS/BLUE SHIELD

## 2022-04-15 ENCOUNTER — Other Ambulatory Visit (HOSPITAL_COMMUNITY)
Admission: RE | Admit: 2022-04-15 | Discharge: 2022-04-15 | Disposition: A | Discharge status: Home / Self Care | Payer: BLUE CROSS/BLUE SHIELD | Source: Ambulatory Visit | Attending: Obstetrics and Gynecology | Admitting: Obstetrics and Gynecology

## 2022-04-15 DIAGNOSIS — N760 Acute vaginitis: Secondary | ICD-10-CM | POA: Diagnosis not present

## 2022-04-15 DIAGNOSIS — N898 Other specified noninflammatory disorders of vagina: Secondary | ICD-10-CM

## 2022-04-15 NOTE — Progress Notes (Signed)
..  SUBJECTIVE:  31 y.o. female complains of  vaginal discharge for 3 day(s). Denies abnormal vaginal bleeding or significant pelvic pain or fever. No UTI symptoms. New Partner; Denies history of known exposure to STD.  No LMP recorded.  OBJECTIVE:  She appears well, afebrile. Urine dipstick: not done.  ASSESSMENT:  Vaginal Discharge  Vaginal Irritation New Partner   PLAN:  GC, chlamydia, trichomonas, BVAG, CVAG probe sent to lab. Treatment: To be determined once lab results are received ROV prn if symptoms persist or worsen.

## 2022-04-16 ENCOUNTER — Telehealth (INDEPENDENT_AMBULATORY_CARE_PROVIDER_SITE_OTHER): Payer: BLUE CROSS/BLUE SHIELD | Admitting: Adult Health

## 2022-04-16 ENCOUNTER — Encounter: Payer: Self-pay | Admitting: Adult Health

## 2022-04-16 DIAGNOSIS — F909 Attention-deficit hyperactivity disorder, unspecified type: Secondary | ICD-10-CM

## 2022-04-16 DIAGNOSIS — F411 Generalized anxiety disorder: Secondary | ICD-10-CM

## 2022-04-16 DIAGNOSIS — F331 Major depressive disorder, recurrent, moderate: Secondary | ICD-10-CM | POA: Diagnosis not present

## 2022-04-16 LAB — CERVICOVAGINAL ANCILLARY ONLY
Bacterial Vaginitis (gardnerella): POSITIVE — AB
Candida Glabrata: NEGATIVE
Candida Vaginitis: NEGATIVE
Chlamydia: NEGATIVE
Comment: NEGATIVE
Comment: NEGATIVE
Comment: NEGATIVE
Comment: NEGATIVE
Comment: NEGATIVE
Comment: NORMAL
Neisseria Gonorrhea: NEGATIVE
Trichomonas: NEGATIVE

## 2022-04-16 MED ORDER — AMPHETAMINE-DEXTROAMPHET ER 20 MG PO CP24: 20.0000 mg | ORAL_CAPSULE | Freq: Two times a day (BID) | ORAL | 0 refills | Status: DC

## 2022-04-16 MED ORDER — SERTRALINE HCL 50 MG PO TABS: 50.0000 mg | ORAL_TABLET | Freq: Every day | ORAL | 1 refills | Status: DC

## 2022-04-16 NOTE — Progress Notes (Signed)
Kim Stanton [id number] May 07, 1991 31 y.o.  Virtual Visit via Video Note  I connected with pt @ on 04/16/22 at  2:00 PM EDT by a video enabled telemedicine application and verified that I am speaking with the correct person using two identifiers.   I discussed the limitations of evaluation and management by telemedicine and the availability of in person appointments. The patient expressed understanding and agreed to proceed.  I discussed the assessment and treatment plan with the patient. The patient was provided an opportunity to ask questions and all were answered. The patient agreed with the plan and demonstrated an understanding of the instructions.   The patient was advised to call back or seek an in-person evaluation if the symptoms worsen or if the condition fails to improve as anticipated.  I provided 20 minutes of non-face-to-face time during this encounter.  The patient was located at home.  The provider was located at [facility].   Dorothyann Gibbs, NP   Subjective:   Patient ID:  Kim Stanton is a 31 y.o. (DOB April 15, 1991) female.  Chief Complaint: No chief complaint on file.   HPI [name] presents for follow-up of MDD, GAD, and ADHD.   Describes mood today as "ok". Pleasant. Tearful at times. Mood symptoms - denies anxiety, depression and irritability. Mood is consistent. Stating "I am doing ok". Feels like medications are working for her. She and daughter doing well. Stable interest and motivation. Taking medications as prescribed.  Energy levels stable. Active, does not have a regular exercise routine. Walking 10 miles a day. Enjoys some usual interests and activities. Single. Lives with mother, sister and daughter, age 41. Spending time with family. Appetite adequate. Weight loss - 161 pounds. Sleeps well most nights. Averages 7 to 8 hours. Focus and concentration difficulties. Completing tasks. Managing aspects of household. 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 Plans to restart school in January. Denies SI or HI.  Denies AH or VH.  Previous medication trial: Adderall XR   Review of Systems:  Review of Systems  Musculoskeletal:  Negative for gait problem.  Neurological:  Negative for tremors.  Psychiatric/Behavioral:         Please refer to HPI    Medications: I have reviewed the patient's current medications.  Current Outpatient Medications  Medication Sig Dispense Refill   amphetamine-dextroamphetamine (ADDERALL XR) 20 MG 24 hr capsule Take 1 capsule (20 mg total) by mouth 2 (two) times daily. (Patient not taking: Reported on 04/15/2022) 60 capsule 0   amphetamine-dextroamphetamine (ADDERALL XR) 20 MG 24 hr capsule Take 1 capsule (20 mg total) by mouth 2 (two) times daily. (Patient not taking: Reported on 04/15/2022) 60 capsule 0   amphetamine-dextroamphetamine (ADDERALL XR) 20 MG 24 hr capsule Take 1 capsule (20 mg total) by mouth 2 (two) times daily. (Patient not taking: Reported on 04/15/2022) 60 capsule 0   benzonatate (TESSALON) 100 MG capsule Take 1 capsule (100 mg total) by mouth every 8 (eight) hours as needed for cough. (Patient not taking: Reported on 04/15/2022) 21 capsule 0   cephALEXin (KEFLEX) 500 MG capsule Take 1 capsule (500 mg total) by mouth 4 (four) times daily. (Patient not taking: Reported on 04/15/2022) 28 capsule 0   cetirizine (ZYRTEC ALLERGY) 10 MG tablet Take 1 tablet (10 mg total) by mouth daily. (Patient not taking: Reported on 04/15/2022) 30 tablet 0   cyclobenzaprine (FLEXERIL) 5 MG tablet TAKE 1-2 TABLETS (5-10 MG TOTAL) BY MOUTH 3 (THREE) TIMES DAILY  AS NEEDED FOR MUSCLE SPASMS. (Patient not taking: Reported on 04/15/2022) 30 tablet 3   ibuprofen (ADVIL) 600 MG tablet Take 1 tablet (600 mg total) by mouth every 6 (six) hours as needed. (Patient not taking: Reported on 04/15/2022) 30 tablet 0   Prenatal Vit-Fe Fumarate-FA (PRENATAL VITAMINS PLUS PO) Take by mouth. (Patient not taking: Reported on  04/15/2022)     pseudoephedrine (SUDAFED) 30 MG tablet Take 1 tablet (30 mg total) by mouth every 8 (eight) hours as needed for congestion. (Patient not taking: Reported on 04/15/2022) 30 tablet 0   sertraline (ZOLOFT) 100 MG tablet TAKE 1 TABLET BY MOUTH EVERY DAY 90 tablet 0   valACYclovir (VALTREX) 1000 MG tablet Take 1 tablet (1,000 mg total) by mouth 2 (two) times daily. Take for 5 days 30 tablet PRN   No current facility-administered medications for this visit.    Medication Side Effects: None  Allergies:  Allergies  Allergen Reactions   Lactose Intolerance (Gi) Nausea Only and Other (See Comments)    Upsets the stomach    Past Medical History:  Diagnosis Date   Encounter for supervision of normal pregnancy, antepartum 12/21/2018    Nursing Staff Provider Office Location  [facility] Dating   LMP 10/10/2018 Language   English Anatomy US   11/25/2018 Flu Vaccine   Declined 12/21/2018 Genetic Screen  NIPS:   AFP:   First Screen:  Quad:   TDaP vaccine    Hgb A1C or  GTT Early  Third trimester  Rhogam     LAB RESULTS  Feeding Plan  Breast Blood Type --/--/AB POS Performed at [facility], 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   Medical history non-contributory     Family History  Problem Relation Age of Onset   Cancer Maternal Grandmother        Breast   Diabetes Maternal Grandmother     Social History   Socioeconomic History   Marital status: Single    Spouse name: Not on file   Number of children: Not on file   Years of education: Not on file   Highest education level: Not on file  Occupational History   Not on file  Tobacco Use   Smoking status: Former    Types: Cigarettes    Quit date: 11/09/2018    Years since quitting: 3.4   Smokeless tobacco: Never  Vaping Use   Vaping Use: Former  Substance and Sexual Activity   Alcohol use: Not Currently    Alcohol/week: 2.0 standard drinks of  alcohol    Types: 2 Shots of liquor per week    Comment: often WEEKENDS (05/30/18)   Drug use: Never   Sexual activity: Yes    Partners: Male  Other Topics Concern   Not on file  Social History Narrative   Not on file   Social Determinants of Health   Financial Resource Strain: Not on file  Food Insecurity: Not on file  Transportation Needs: Not on file  Physical Activity: Not on file  Stress: Not on file  Social Connections: Not on file  Intimate Partner Violence: Not on file    Past Medical History, Surgical history, Social history, and Family history were reviewed and updated as appropriate.   Please see review of systems for further details on the patient's review from today.   Objective:   Physical Exam:  There were no vitals taken  for this visit.  Physical Exam Constitutional:      General: She is not in acute distress. Musculoskeletal:        General: No deformity.  Neurological:     Mental Status: She is alert and oriented to person, place, and time.     Coordination: Coordination normal.  Psychiatric:        Attention and Perception: Attention and perception normal. She does not perceive auditory or visual hallucinations.        Mood and Affect: Mood normal. Mood is not anxious or depressed. Affect is not labile, blunt, angry or inappropriate.        Speech: Speech normal.        Behavior: Behavior normal.        Thought Content: Thought content normal. Thought content is not paranoid or delusional. Thought content does not include homicidal or suicidal ideation. Thought content does not include homicidal or suicidal plan.        Cognition and Memory: Cognition and memory normal.        Judgment: Judgment normal.     Comments: Insight intact     Lab Review:     Component Value Date/Time   NA 134 (L) 07/16/2019 1950   NA 137 06/15/2019 1047   K 4.0 07/16/2019 1950   CL 104 07/16/2019 1950   CO2 19 (L) 07/16/2019 1950   GLUCOSE 91 07/16/2019 1950   BUN  14 07/16/2019 1950   BUN 5 (L) 06/15/2019 1047   CREATININE 0.69 07/16/2019 1950   CALCIUM 9.4 07/16/2019 1950   PROT 6.5 07/16/2019 1950   PROT 6.3 06/15/2019 1047   ALBUMIN 3.4 (L) 07/16/2019 1950   ALBUMIN 3.9 06/15/2019 1047   AST 25 07/16/2019 1950   ALT 13 07/16/2019 1950   ALKPHOS 196 (H) 07/16/2019 1950   BILITOT 0.4 07/16/2019 1950   BILITOT <0.2 06/15/2019 1047   GFRNONAA >60 07/16/2019 1950   GFRAA >60 07/16/2019 1950       Component Value Date/Time   WBC 7.0 07/16/2019 1923   RBC 4.27 07/16/2019 1923   HGB 12.8 07/16/2019 1923   HGB 11.4 05/02/2019 1015   HCT 38.4 07/16/2019 1923   HCT 34.7 05/02/2019 1015   PLT 333 07/16/2019 1923   PLT 288 05/02/2019 1015   MCV 89.9 07/16/2019 1923   MCV 91 05/02/2019 1015   MCH 30.0 07/16/2019 1923   MCHC 33.3 07/16/2019 1923   RDW 14.6 07/16/2019 1923   RDW 13.2 05/02/2019 1015   LYMPHSABS 2.7 12/21/2018 1133   MONOABS 0.5 05/24/2018 1249   EOSABS 0.1 12/21/2018 1133   BASOSABS 0.0 12/21/2018 1133    No results found for: "POCLITH", "LITHIUM"   No results found for: "PHENYTOIN", "PHENOBARB", "VALPROATE", "CBMZ"   .res Assessment: Plan:    Plan:  PDMP reviewed  1. Adderall XR 20mg  daily to BID 2. Zoloft 50mg  daily  Monitor BP between visits.  Read and reviewed note with patient for accuracy.   RTC 4 weeks  Patient advised to contact office with any questions, adverse effects, or acute worsening in signs and symptoms.  Discussed potential benefits, risks, and side effects of stimulants with patient to include increased heart rate, palpitations, insomnia, increased anxiety, increased irritability, or decreased appetite.  Instructed patient to contact office if experiencing any significant tolerability issues.  There are no diagnoses linked to this encounter.   Please see After Visit Summary for patient specific instructions.  Future Appointments  Date Time Provider  [place]  04/16/2022  2:00 PM  Eliga Arvie, Berdie Ogren, NP CP-CP None    No orders of the defined types were placed in this encounter.     -------------------------------

## 2022-04-19 ENCOUNTER — Telehealth: Payer: Self-pay | Admitting: Adult Health

## 2022-04-19 MED ORDER — METRONIDAZOLE 500 MG PO TABS: 500.0000 mg | ORAL_TABLET | Freq: Two times a day (BID) | ORAL | 0 refills | Status: DC

## 2022-04-19 NOTE — Addendum Note (Signed)
Addended by: Catalina Antigua on: 04/19/2022 09:31 AM   Modules accepted: Orders

## 2022-04-19 NOTE — Telephone Encounter (Signed)
I called pt @ 3:13p to schedule follow up appt.  She advised her pharmacy, [facility] said she needs a PA for the Adderall.  Next appt 10/24

## 2022-04-21 ENCOUNTER — Telehealth: Payer: Self-pay

## 2022-04-21 NOTE — Telephone Encounter (Signed)
Prior Authorization initiated amphetamine-dextroamphetamine (ADDERALL XR) 20 MG 24 hr capsule  #60 [employer]  Approval received Effective:  04/21/2022 - 04/22/2023

## 2022-04-21 NOTE — Telephone Encounter (Signed)
PA initiated

## 2022-05-12 ENCOUNTER — Other Ambulatory Visit: Payer: Self-pay | Admitting: Obstetrics and Gynecology

## 2022-05-17 ENCOUNTER — Ambulatory Visit
Admission: RE | Admit: 2022-05-17 | Discharge: 2022-05-17 | Disposition: A | Discharge status: Home / Self Care | Payer: BLUE CROSS/BLUE SHIELD | Source: Ambulatory Visit | Attending: Internal Medicine | Admitting: Internal Medicine

## 2022-05-17 VITALS — BP 106/72 | HR 74 | Temp 97.9°F | Resp 16

## 2022-05-17 DIAGNOSIS — R300 Dysuria: Secondary | ICD-10-CM | POA: Diagnosis present

## 2022-05-17 DIAGNOSIS — M546 Pain in thoracic spine: Secondary | ICD-10-CM | POA: Diagnosis present

## 2022-05-17 DIAGNOSIS — N898 Other specified noninflammatory disorders of vagina: Secondary | ICD-10-CM | POA: Insufficient documentation

## 2022-05-17 LAB — POCT URINALYSIS DIP (MANUAL ENTRY)
Bilirubin, UA: NEGATIVE
Glucose, UA: NEGATIVE mg/dL
Leukocytes, UA: NEGATIVE
Nitrite, UA: NEGATIVE
Protein Ur, POC: 30 mg/dL — AB
Spec Grav, UA: >=1.03 — AB (ref 1.010–1.025)
Urobilinogen, UA: 0.2 E.U./dL
pH, UA: 6 (ref 5.0–8.0)

## 2022-05-17 LAB — POCT URINE PREGNANCY: Preg Test, Ur: NEGATIVE

## 2022-05-17 MED ORDER — METHOCARBAMOL 500 MG PO TABS: 500.0000 mg | ORAL_TABLET | Freq: Every day | ORAL | 0 refills | Status: DC | PRN

## 2022-05-17 MED ORDER — FLUCONAZOLE 150 MG PO TABS: 150.0000 mg | ORAL_TABLET | ORAL | 0 refills | Status: DC

## 2022-05-17 NOTE — Discharge Instructions (Signed)
Your back pain appears to be musculoskeletal in nature.  A muscle relaxer has been prescribed to help alleviate discomfort.  Also recommend alternating ice and heat to affected area.  Urine test is not definitive for urinary tract infection.  I am suspicious that you have a yeast infection so Diflucan has been sent to treat this.  Vaginal swab is pending.  We will call if it is abnormal.

## 2022-05-17 NOTE — ED Triage Notes (Signed)
Pt presents to [facility] with co of flank pain and dysuria as well as upper back pain for 3 days. Pt reports azo at home.

## 2022-05-17 NOTE — ED Provider Notes (Signed)
[name]    CSN: [id number] Arrival date & time: 05/17/22  1810      History   Chief Complaint Chief Complaint  Patient presents with   Back Pain    My back has been hurt for two days - Entered by patient    HPI Kim Stanton is a 31 y.o. female.   Patient presents with several different chief complaints today.  Patient reports left thoracic back pain that started about 3 days ago.  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  Denies any other obvious injuries to the area.  Denies numbness or tingling.  Patient has not taken any medications to help alleviate pain.  Patient also reports some urinary burning, urinary frequency, white vaginal discharge that started around the same time.  Denies abdominal pain, lower back pain, fever, abnormal vaginal bleeding or hematuria.  Denies any exposure to STD.  Last menstrual cycle was about 3 weeks ago.  Patient has used over-the-counter yeast medication with no improvement in symptoms.   Back Pain   Past Medical History:  Diagnosis Date   Encounter for supervision of normal pregnancy, antepartum 12/21/2018    Nursing Staff Provider Office Location  [facility] Dating   LMP 10/10/2018 Language   English Anatomy US   11/25/2018 Flu Vaccine   Declined 12/21/2018 Genetic Screen  NIPS:   AFP:   First Screen:  Quad:   TDaP vaccine    Hgb A1C or  GTT Early  Third trimester  Rhogam     LAB RESULTS  Feeding Plan  Breast Blood Type --/--/AB POS Performed at [facility], 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 Authorizing Provider  fluconazole (DIFLUCAN) 150 MG tablet Take 1 tablet (150 mg total) by mouth every 3 (three) days. 05/17/22  Yes Shalah Estelle, Rolly Salter E, FNP  methocarbamol (ROBAXIN) 500 MG tablet Take 1 tablet (500 mg total) by mouth daily as needed for muscle spasms. 05/17/22  Yes Kester Stimpson, Rolly Salter E, FNP  amphetamine-dextroamphetamine (ADDERALL XR) 20 MG 24 hr capsule Take 1 capsule (20 mg total) by mouth 2 (two) times daily. 04/16/22   Mozingo, Thereasa Solo, NP  amphetamine-dextroamphetamine (ADDERALL XR) 20 MG 24 hr capsule Take 1 capsule (20 mg total) by mouth 2 (two) times daily. 05/14/22   Mozingo, Thereasa Solo, NP  amphetamine-dextroamphetamine (ADDERALL XR) 20 MG 24 hr capsule Take 1 capsule (20 mg total) by mouth 2 (two) times daily. 06/11/22   Mozingo, Thereasa Solo, NP  benzonatate (TESSALON) 100  MG capsule Take 1 capsule (100 mg total) by mouth every 8 (eight) hours as needed for cough. Patient not taking: Reported on 04/15/2022 09/22/21   Gustavus Bryant, FNP  cephALEXin (KEFLEX) 500 MG capsule Take 1 capsule (500 mg total) by mouth 4 (four) times daily. Patient not taking: Reported on 04/15/2022 11/18/19   Judeth Horn, NP  cyclobenzaprine (FLEXERIL) 5 MG tablet TAKE 1-2 TABLETS (5-10 MG TOTAL) BY MOUTH 3 (THREE) TIMES DAILY AS NEEDED FOR MUSCLE SPASMS. Patient not taking: Reported on 04/15/2022 05/30/20   Cristie Hem, PA-C  ibuprofen (ADVIL) 600 MG tablet Take 1 tablet (600 mg total) by mouth every 6 (six) hours as needed. Patient not taking: Reported on 04/15/2022 11/18/19   Judeth Horn, NP  metroNIDAZOLE (FLAGYL) 500 MG tablet Take 1 tablet (500 mg total) by mouth 2 (two) times daily. 04/19/22   Constant, Peggy, MD  Prenatal Vit-Fe Fumarate-FA (PRENATAL VITAMINS PLUS PO) Take by  mouth. Patient not taking: Reported on 04/15/2022    [provider]  pseudoephedrine (SUDAFED) 30 MG tablet Take 1 tablet (30 mg total) by mouth every 8 (eight) hours as needed for congestion. Patient not taking: Reported on 04/15/2022 08/27/21   Wallis Bamberg, PA-C  sertraline (ZOLOFT) 100 MG tablet TAKE 1 TABLET BY MOUTH EVERY DAY 09/23/21   Mozingo, Thereasa Solo, NP  sertraline (ZOLOFT) 50 MG tablet Take 1 tablet (50 mg total) by mouth daily. 04/16/22   Mozingo, Thereasa Solo, NP  valACYclovir (VALTREX) 1000 MG tablet Take 1 tablet (1,000 mg total) by mouth 2 (two) times daily. Take for 5 days 05/14/21   Constant, Peggy, MD    Family History Family History  Problem Relation Age of Onset   Cancer Maternal Grandmother        Breast   Diabetes Maternal Grandmother     Social History Social History   Tobacco Use   Smoking status: Former    Types: Cigarettes    Quit date: 11/09/2018    Years since quitting: 3.5   Smokeless tobacco: Never  Vaping Use   Vaping Use: Former  Substance Use Topics   Alcohol use: Not Currently    Alcohol/week: 2.0 standard drinks of alcohol    Types: 2 Shots of liquor per week    Comment: often WEEKENDS (05/30/18)   Drug use: Never     Allergies   Lactose intolerance (gi)   Review of Systems Review of Systems Per HPI  Physical Exam Triage Vital Signs ED Triage Vitals  Enc Vitals Group     BP 05/17/22 1906 106/72     Pulse Rate 05/17/22 1906 74     Resp 05/17/22 1906 16     Temp 05/17/22 1906 97.9 F (36.6 C)     Temp Source 05/17/22 1906 Oral     SpO2 05/17/22 1906 98 %     Weight --      Height --      Head Circumference --      Peak Flow --      Pain Score 05/17/22 1909 7     Pain Loc --      Pain Edu? --      Excl. in GC? --    No data found.  Updated Vital Signs BP 106/72 (BP Location: Right Arm)   Pulse 74   Temp 97.9 F (36.6 C) (Oral)   Resp 16   SpO2 98%   Visual Acuity Right Eye Distance:   Left Eye  Distance:  Bilateral Distance:    Right Eye Near:   Left Eye Near:    Bilateral Near:     Physical Exam Constitutional:      General: She is not in acute distress.    Appearance: Normal appearance. She is not toxic-appearing or diaphoretic.  HENT:     Head: Normocephalic and atraumatic.  Eyes:     Extraocular Movements: Extraocular movements intact.     Conjunctiva/sclera: Conjunctivae normal.  Cardiovascular:     Rate and Rhythm: Normal rate and regular rhythm.     Pulses: Normal pulses.     Heart sounds: Normal heart sounds.  Pulmonary:     Effort: Pulmonary effort is normal. No respiratory distress.     Breath sounds: Normal breath sounds.  Abdominal:     General: Bowel sounds are normal. There is no distension.     Palpations: Abdomen is soft.     Tenderness: There is no abdominal tenderness.  Genitourinary:    Comments: Deferred with shared decision making. Self swab performed. Musculoskeletal:       Back:     Comments: Tenderness to palpation to left mid thoracic back.  No direct spinal tenderness, crepitus, step-off.  Neurological:     General: No focal deficit present.     Mental Status: She is alert and oriented to person, place, and time. Mental status is at baseline.  Psychiatric:        Mood and Affect: Mood normal.        Behavior: Behavior normal.        Thought Content: Thought content normal.        Judgment: Judgment normal.      UC Treatments / Results  Labs (all labs ordered are listed, but only abnormal results are displayed) Labs Reviewed  POCT URINALYSIS DIP (MANUAL ENTRY) - Abnormal; Notable for the following components:      Result Value   Ketones, POC UA trace (5) (*)    Spec Grav, UA >=1.030 (*)    Blood, UA moderate (*)    Protein Ur, POC =30 (*)    All other components within normal limits  POCT URINE PREGNANCY  CERVICOVAGINAL ANCILLARY ONLY    EKG   Radiology No results found.  Procedures Procedures (including critical  care time)  Medications Ordered in UC Medications - No data to display  Initial Impression / Assessment and Plan / UC Course  I have reviewed the triage vital signs and the nursing notes.  Pertinent labs & imaging results that were available during my care of the patient were reviewed by me and considered in my medical decision making (see chart for details).     Back pain and urinary symptoms appear to be unrelated.  Back pain appears to be musculoskeletal strain.  Will treat with muscle relaxer.  Patient advised to take sparingly as it can cause drowsiness.  Patient advised to avoid driving while taking this medication.  Discussed alternating ice and heat to affected area as well.  Urinalysis was negative for any UTI.  Highly suspicious that vaginal yeast infection could be present.  Patient denies any concern for STDs so will test for BV and yeast on cervical vaginal swab.  Will opt to treat with Diflucan and await vaginal swab results for any further treatment.  UA showing small amounts of protein and ketones but this is most likely related to low p.o. intake and there is no concern for any other worrisome etiology.  Patient to follow-up if  symptoms persist or worsen.  Patient verbalized understanding and was agreeable with plan. Final Clinical Impressions(s) / UC Diagnoses   Final diagnoses:  Acute left-sided thoracic back pain  Vaginal discharge  Dysuria     Discharge Instructions      Your back pain appears to be musculoskeletal in nature.  A muscle relaxer has been prescribed to help alleviate discomfort.  Also recommend alternating ice and heat to affected area.  Urine test is not definitive for urinary tract infection.  I am suspicious that you have a yeast infection so Diflucan has been sent to treat this.  Vaginal swab is pending.  We will call if it is abnormal.    ED Prescriptions     Medication Sig Dispense Auth. Provider   methocarbamol (ROBAXIN) 500 MG tablet Take 1  tablet (500 mg total) by mouth daily as needed for muscle spasms. 20 tablet Chapman, Vian E, [place]   fluconazole (DIFLUCAN) 150 MG tablet Take 1 tablet (150 mg total) by mouth every 3 (three) days. 2 tablet Sheldon, Acie Fredrickson, [place]      PDMP not reviewed this encounter.   Gustavus Bryant, [place] 05/17/22 2025

## 2022-05-19 ENCOUNTER — Telehealth (HOSPITAL_COMMUNITY): Payer: Self-pay | Admitting: Emergency Medicine

## 2022-05-19 LAB — CERVICOVAGINAL ANCILLARY ONLY
Bacterial Vaginitis (gardnerella): POSITIVE — AB
Candida Glabrata: NEGATIVE
Candida Vaginitis: NEGATIVE
Comment: NEGATIVE
Comment: NEGATIVE
Comment: NEGATIVE

## 2022-05-19 MED ORDER — METRONIDAZOLE 500 MG PO TABS: 500.0000 mg | ORAL_TABLET | Freq: Two times a day (BID) | ORAL | 0 refills | Status: DC

## 2022-07-20 ENCOUNTER — Telehealth (INDEPENDENT_AMBULATORY_CARE_PROVIDER_SITE_OTHER): Payer: Self-pay | Admitting: Adult Health

## 2022-07-20 DIAGNOSIS — F489 Nonpsychotic mental disorder, unspecified: Secondary | ICD-10-CM

## 2022-07-20 NOTE — Progress Notes (Signed)
Patient no show appointment. Connected to video call - patient not present x 10 minutes. Attempted to call and connect with no answer - LVM for patient to R/S.

## 2022-07-26 ENCOUNTER — Telehealth (INDEPENDENT_AMBULATORY_CARE_PROVIDER_SITE_OTHER): Payer: BLUE CROSS/BLUE SHIELD | Admitting: Adult Health

## 2022-07-26 ENCOUNTER — Encounter: Payer: Self-pay | Admitting: Adult Health

## 2022-07-26 DIAGNOSIS — F909 Attention-deficit hyperactivity disorder, unspecified type: Secondary | ICD-10-CM | POA: Diagnosis not present

## 2022-07-26 DIAGNOSIS — F331 Major depressive disorder, recurrent, moderate: Secondary | ICD-10-CM

## 2022-07-26 DIAGNOSIS — F411 Generalized anxiety disorder: Secondary | ICD-10-CM

## 2022-07-26 MED ORDER — AMPHETAMINE-DEXTROAMPHET ER 20 MG PO CP24: 20.0000 mg | ORAL_CAPSULE | Freq: Two times a day (BID) | ORAL | 0 refills | Status: DC

## 2022-07-26 NOTE — Progress Notes (Signed)
Kim Stanton [id number] Oct 07, 1990 31 y.o.  Virtual Visit via Video Note  I connected with pt @ on 07/26/22 at  1:20 PM EDT by a video enabled telemedicine application and verified that I am speaking with the correct person using two identifiers.   I discussed the limitations of evaluation and management by telemedicine and the availability of in person appointments. The patient expressed understanding and agreed to proceed.  I discussed the assessment and treatment plan with the patient. The patient was provided an opportunity to ask questions and all were answered. The patient agreed with the plan and demonstrated an understanding of the instructions.   The patient was advised to call back or seek an in-person evaluation if the symptoms worsen or if the condition fails to improve as anticipated.  I provided 20 minutes of non-face-to-face time during this encounter.  The patient was located at home.  The provider was located at [place].   Kim Gell, NP   Subjective:   Patient ID:  Kim Stanton is a 31 y.o. (DOB [birth year]) female.  Chief Complaint: No chief complaint on file.   HPI Kim Stanton presents for follow-up of MDD, GAD and ADHD.   Describes mood today as "ok". Pleasant. Tearful at times. Mood symptoms - denies anxiety, depression and irritability. Feels overwhelmed - "every now and then". Mood is consistent. Stating "I am doing alright". Feels like medication are working for her. She and daughter doing well. Stable interest and motivation. Taking medications as prescribed.  Energy levels stable. Active, has a regular exercise routine. Walking 10 miles a day. Enjoys some usual interests and activities. Single. Lives with mother and daughter, age 33. Spending time with family. Appetite adequate. Weight loss - 148 from 161 pounds. Sleeps well most nights. Averages 7 to 8 hours. Focus and concentration difficulties. Completing tasks. Managing aspects  of household. 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   Denies SI or HI.  Denies AH or VH.  Previous medication trial: Adderall XR  Review of Systems:  Review of Systems  Musculoskeletal:  Negative for gait problem.  Neurological:  Negative for tremors.  Psychiatric/Behavioral:         Please refer to HPI    Medications: I have reviewed the patient's current medications.  Current Outpatient Medications  Medication Sig Dispense Refill   amphetamine-dextroamphetamine (ADDERALL XR) 20 MG 24 hr capsule Take 1 capsule (20 mg total) by mouth 2 (two) times daily. 60 capsule 0   [START ON 08/23/2022] amphetamine-dextroamphetamine (ADDERALL XR) 20 MG 24 hr capsule Take 1 capsule (20 mg total) by mouth 2 (two) times daily. 60 capsule 0   [START ON 09/20/2022] amphetamine-dextroamphetamine (ADDERALL XR) 20 MG 24 hr capsule Take 1 capsule (20 mg total) by mouth 2 (two) times daily. 60 capsule 0   benzonatate (TESSALON) 100 MG capsule Take 1 capsule (100 mg total) by mouth every 8 (eight) hours as needed for cough. (Patient not taking: Reported on 04/15/2022) 21 capsule 0   cyclobenzaprine (FLEXERIL) 5 MG tablet TAKE 1-2 TABLETS (5-10 MG TOTAL) BY MOUTH 3 (THREE) TIMES DAILY AS NEEDED FOR MUSCLE SPASMS. (Patient not taking: Reported on 04/15/2022) 30 tablet 3   fluconazole (DIFLUCAN) 150 MG tablet Take 1 tablet (150 mg total) by mouth every 3 (three) days. 2 tablet 0   ibuprofen (ADVIL) 600 MG tablet Take 1 tablet (600 mg total) by mouth every 6 (six) hours as needed. (Patient not taking: Reported on 04/15/2022) 30 tablet  0   methocarbamol (ROBAXIN) 500 MG tablet Take 1 tablet (500 mg total) by mouth daily as needed for muscle spasms. 20 tablet 0   metroNIDAZOLE (FLAGYL) 500 MG tablet Take 1 tablet (500 mg total) by mouth 2 (two) times daily. 14 tablet 0   Prenatal Vit-Fe Fumarate-FA (PRENATAL VITAMINS PLUS PO) Take by mouth. (Patient not taking: Reported on 04/15/2022)     pseudoephedrine (SUDAFED) 30 MG  tablet Take 1 tablet (30 mg total) by mouth every 8 (eight) hours as needed for congestion. (Patient not taking: Reported on 04/15/2022) 30 tablet 0   sertraline (ZOLOFT) 100 MG tablet TAKE 1 TABLET BY MOUTH EVERY DAY 90 tablet 0   sertraline (ZOLOFT) 50 MG tablet Take 1 tablet (50 mg total) by mouth daily. 90 tablet 1   valACYclovir (VALTREX) 1000 MG tablet Take 1 tablet (1,000 mg total) by mouth 2 (two) times daily. Take for 5 days 30 tablet PRN   No current facility-administered medications for this visit.    Medication Side Effects: None  Allergies:  Allergies  Allergen Reactions   Lactose Intolerance (Gi) Nausea Only and Other (See Comments)    Upsets the stomach    Past Medical History:  Diagnosis Date   Encounter for supervision of normal pregnancy, antepartum 12/21/2018    Nursing Staff Provider Office Location  [facility] Dating   LMP 10/10/2018 Language   English Anatomy US   11/25/2018 Flu Vaccine   Declined 12/21/2018 Genetic Screen  NIPS:   AFP:   First Screen:  Quad:   TDaP vaccine    Hgb A1C or  GTT Early  Third trimester  Rhogam     LAB RESULTS  Feeding Plan  Breast Blood Type --/--/AB POS Performed at [facility], 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   Medical history non-contributory     Family History  Problem Relation Age of Onset   Cancer Maternal Grandmother        Breast   Diabetes Maternal Grandmother     Social History   Socioeconomic History   Marital status: Single    Spouse name: Not on file   Number of children: Not on file   Years of education: Not on file   Highest education level: Not on file  Occupational History   Not on file  Tobacco Use   Smoking status: Former    Types: Cigarettes    Quit date: 11/09/2018    Years since quitting: 3.7   Smokeless tobacco: Never  Vaping Use   Vaping Use: Former  Substance and Sexual Activity   Alcohol use: Not Currently     Alcohol/week: 2.0 standard drinks of alcohol    Types: 2 Shots of liquor per week    Comment: often WEEKENDS (05/30/18)   Drug use: Never   Sexual activity: Yes    Partners: Male  Other Topics Concern   Not on file  Social History Narrative   Not on file   Social Determinants of Health   Financial Resource Strain: Not on file  Food Insecurity: Not on file  Transportation Needs: Not on file  Physical Activity: Not on file  Stress: Not on file  Social Connections: Not on file  Intimate Partner Violence: Not on file    Past Medical History, Surgical history, Social history, and Family history were reviewed and updated as appropriate.   Please see review of systems  for further details on the patient's review from today.   Objective:   Physical Exam:  There were no vitals taken for this visit.  Physical Exam Constitutional:      General: She is not in acute distress. Musculoskeletal:        General: No deformity.  Neurological:     Mental Status: She is alert and oriented to person, place, and time.     Coordination: Coordination normal.  Psychiatric:        Attention and Perception: Attention and perception normal. She does not perceive auditory or visual hallucinations.        Mood and Affect: Mood normal. Mood is not anxious or depressed. Affect is not labile, blunt, angry or inappropriate.        Speech: Speech normal.        Behavior: Behavior normal.        Thought Content: Thought content normal. Thought content is not paranoid or delusional. Thought content does not include homicidal or suicidal ideation. Thought content does not include homicidal or suicidal plan.        Cognition and Memory: Cognition and memory normal.        Judgment: Judgment normal.     Comments: Insight intact     Lab Review:     Component Value Date/Time   NA 134 (L) 07/16/2019 1950   NA 137 06/15/2019 1047   K 4.0 07/16/2019 1950   CL 104 07/16/2019 1950   CO2 19 (L) 07/16/2019  1950   GLUCOSE 91 07/16/2019 1950   BUN 14 07/16/2019 1950   BUN 5 (L) 06/15/2019 1047   CREATININE 0.69 07/16/2019 1950   CALCIUM 9.4 07/16/2019 1950   PROT 6.5 07/16/2019 1950   PROT 6.3 06/15/2019 1047   ALBUMIN 3.4 (L) 07/16/2019 1950   ALBUMIN 3.9 06/15/2019 1047   AST 25 07/16/2019 1950   ALT 13 07/16/2019 1950   ALKPHOS 196 (H) 07/16/2019 1950   BILITOT 0.4 07/16/2019 1950   BILITOT <0.2 06/15/2019 1047   GFRNONAA >60 07/16/2019 1950   GFRAA >60 07/16/2019 1950       Component Value Date/Time   WBC 7.0 07/16/2019 1923   RBC 4.27 07/16/2019 1923   HGB 12.8 07/16/2019 1923   HGB 11.4 05/02/2019 1015   HCT 38.4 07/16/2019 1923   HCT 34.7 05/02/2019 1015   PLT 333 07/16/2019 1923   PLT 288 05/02/2019 1015   MCV 89.9 07/16/2019 1923   MCV 91 05/02/2019 1015   MCH 30.0 07/16/2019 1923   MCHC 33.3 07/16/2019 1923   RDW 14.6 07/16/2019 1923   RDW 13.2 05/02/2019 1015   LYMPHSABS 2.7 12/21/2018 1133   MONOABS 0.5 05/24/2018 1249   EOSABS 0.1 12/21/2018 1133   BASOSABS 0.0 12/21/2018 1133    No results found for: "POCLITH", "LITHIUM"   No results found for: "PHENYTOIN", "PHENOBARB", "VALPROATE", "CBMZ"   .res Assessment: Plan:    Plan:  PDMP reviewed  1. Adderall XR 20mg  daily to BID  D/C Zoloft 50mg  daily - not taking and doing well without it.  Monitor BP between visits.  Read and reviewed note with patient for accuracy.   RTC 3 months  Patient advised to contact office with any questions, adverse effects, or acute worsening in signs and symptoms.  Discussed potential benefits, risks, and side effects of stimulants with patient to include increased heart rate, palpitations, insomnia, increased anxiety, increased irritability, or decreased appetite.  Instructed patient to contact office if experiencing  any significant tolerability issues.  Diagnoses and all orders for this visit:  Major depressive disorder, recurrent episode, moderate  (HCC)  Attention deficit hyperactivity disorder (ADHD), unspecified ADHD type -     amphetamine-dextroamphetamine (ADDERALL XR) 20 MG 24 hr capsule; Take 1 capsule (20 mg total) by mouth 2 (two) times daily. -     amphetamine-dextroamphetamine (ADDERALL XR) 20 MG 24 hr capsule; Take 1 capsule (20 mg total) by mouth 2 (two) times daily. -     amphetamine-dextroamphetamine (ADDERALL XR) 20 MG 24 hr capsule; Take 1 capsule (20 mg total) by mouth 2 (two) times daily.  Generalized anxiety disorder     Please see After Visit Summary for patient specific instructions.  No future appointments.   No orders of the defined types were placed in this encounter.     -------------------------------

## 2022-08-23 ENCOUNTER — Ambulatory Visit: Payer: BLUE CROSS/BLUE SHIELD

## 2022-08-23 ENCOUNTER — Ambulatory Visit: Admit: 2022-08-23 | Payer: BLUE CROSS/BLUE SHIELD

## 2022-08-25 ENCOUNTER — Encounter: Payer: Self-pay | Admitting: Obstetrics and Gynecology

## 2022-08-25 ENCOUNTER — Ambulatory Visit (INDEPENDENT_AMBULATORY_CARE_PROVIDER_SITE_OTHER): Payer: BLUE CROSS/BLUE SHIELD | Admitting: Obstetrics and Gynecology

## 2022-08-25 VITALS — BP 116/76 | HR 102 | Ht 62.0 in | Wt 145.0 lb

## 2022-08-25 DIAGNOSIS — Z3046 Encounter for surveillance of implantable subdermal contraceptive: Secondary | ICD-10-CM | POA: Diagnosis not present

## 2022-08-25 DIAGNOSIS — B009 Herpesviral infection, unspecified: Secondary | ICD-10-CM | POA: Diagnosis not present

## 2022-08-25 MED ORDER — ETONOGESTREL 68 MG ~~LOC~~ IMPL
68.0000 mg | DRUG_IMPLANT | Freq: Once | SUBCUTANEOUS | Status: AC
  Administered 2022-08-25: 68 mg via SUBCUTANEOUS

## 2022-08-25 MED ORDER — VALACYCLOVIR HCL 1 G PO TABS: 1000.0000 mg | ORAL_TABLET | Freq: Two times a day (BID) | ORAL | 99 refills | Status: DC

## 2022-08-25 NOTE — Patient Instructions (Signed)
Nexplanon Instructions After Insertion  Keep bandage clean and dry for 24 hours  May use ice/Tylenol/Ibuprofen for soreness or pain  If you develop fever, drainage or increased warmth from incision site-contact office immediately   

## 2022-08-25 NOTE — Progress Notes (Signed)
     GYNECOLOGY CLINIC PROCEDURE NOTE  Kim Stanton is a 31 y.o. G1P1001 here for Nexplanon removal and Nexplanon insertion.   Nexplanon Removal and Insertion  Patient identified, informed consent performed, consent signed.   Patient does understand that irregular bleeding is a very common side effect of this medication. She was advised to have backup contraception for one week after replacement of the implant. Pregnancy test in clinic today was negative.  Appropriate time out taken. Implanon site identified. Area prepped in usual sterile fashon. One ml of 1% lidocaine was used to anesthetize the area at the distal end of the implant. A small stab incision was made right beside the implant on the distal portion. The Nexplanon rod was grasped using hemostats and removed without difficulty. There was minimal blood loss. There were no complications. Area was then injected with 3 ml of 1 % lidocaine. She was re-prepped with betadine, Nexplanon removed from packaging, Device confirmed in needle, then inserted full length of needle and withdrawn per handbook instructions. Nexplanon was able to palpated in the patient's arm; patient palpated the insert herself.  There was minimal blood loss. Patient insertion site covered with guaze and a pressure bandage to reduce any bruising. The patient tolerated the procedure well and was given post procedure instructions.  She was advised to have backup contraception for one week.     Pt will schedule yearly GYN Exam  Kim Elm MD, FACOG Attending Obstetrician & Gynecologist Center for [facility], [facility]

## 2022-08-25 NOTE — Progress Notes (Addendum)
31 y.o. GYN presents for Nexplanon removal and Insertion.  C/o heavy periods, cramps.  Last PAP 07/03/2019.  Administrations This Visit     etonogestrel (NEXPLANON) implant 68 mg     Admin Date 08/25/2022 Action Given Dose 68 mg Route Subdermal Administered By Maretta Bees, RMA

## 2022-09-06 ENCOUNTER — Other Ambulatory Visit: Payer: Self-pay | Admitting: Adult Health

## 2022-09-06 DIAGNOSIS — F411 Generalized anxiety disorder: Secondary | ICD-10-CM

## 2022-09-06 DIAGNOSIS — F331 Major depressive disorder, recurrent, moderate: Secondary | ICD-10-CM

## 2022-09-09 ENCOUNTER — Ambulatory Visit
Admission: RE | Admit: 2022-09-09 | Discharge: 2022-09-09 | Disposition: A | Discharge status: Home / Self Care | Payer: BLUE CROSS/BLUE SHIELD | Source: Ambulatory Visit | Attending: Family Medicine | Admitting: Family Medicine

## 2022-09-09 VITALS — BP 122/83 | HR 86 | Temp 98.2°F | Resp 17

## 2022-09-09 DIAGNOSIS — J069 Acute upper respiratory infection, unspecified: Secondary | ICD-10-CM | POA: Diagnosis not present

## 2022-09-09 LAB — RESP PANEL BY RT-PCR (FLU A&B, COVID) ARPGX2
Influenza A by PCR: NEGATIVE
Influenza B by PCR: NEGATIVE
SARS Coronavirus 2 by RT PCR: NEGATIVE

## 2022-09-09 NOTE — ED Provider Notes (Signed)
[facility]    CSN: [id number] Arrival date & time: 09/09/22  1404      History   Chief Complaint Chief Complaint  Patient presents with   Cough    HPI Kim Stanton is a 31 y.o. female.   HPI Patient said a cough since yesterday.  Is getting worse with time.  Yesterday she was coughing up clear mucus.  Today she states it is yellow.  She states her daughter is just recovering from influenza.  He denies sore throat.  Denies runny nose.  She has been vaccinated for flu and COVID  Past Medical History:  Diagnosis Date   Encounter for supervision of normal pregnancy, antepartum 12/21/2018    Nursing Staff Provider Office Location  [facility] Dating   LMP 10/10/2018 Language   English Anatomy US   11/25/2018 Flu Vaccine   Declined 12/21/2018 Genetic Screen  NIPS:   AFP:   First Screen:  Quad:   TDaP vaccine    Hgb A1C or  GTT Early  Third trimester  Rhogam     LAB RESULTS  Feeding Plan  Breast Blood Type --/--/AB POS Performed at [facility], 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 Authorizing Provider  amphetamine-dextroamphetamine (ADDERALL XR) 20 MG 24 hr capsule Take 1 capsule (20 mg total) by mouth 2 (two) times daily. 08/23/22   Mozingo, Thereasa Solo, NP  amphetamine-dextroamphetamine (ADDERALL XR) 20 MG 24 hr capsule Take 1 capsule (20 mg  total) by mouth 2 (two) times daily. 09/20/22   Mozingo, Thereasa Solo, NP  fluconazole (DIFLUCAN) 150 MG tablet Take 1 tablet (150 mg total) by mouth every 3 (three) days. 05/17/22   Gustavus Bryant, FNP  methocarbamol (ROBAXIN) 500 MG tablet Take 1 tablet (500 mg total) by mouth daily as needed for muscle spasms. 05/17/22   Gustavus Bryant, FNP  metroNIDAZOLE (FLAGYL) 500 MG tablet Take 1 tablet (500 mg total) by mouth 2 (two) times daily. 05/19/22   Merrilee Jansky, MD  sertraline (ZOLOFT) 100 MG tablet TAKE 1 TABLET BY MOUTH EVERY DAY 09/23/21   Mozingo, Thereasa Solo, NP  sertraline (ZOLOFT) 50 MG tablet Take 1 tablet (50 mg total) by mouth daily. 04/16/22   Mozingo, Thereasa Solo, NP  valACYclovir (VALTREX) 1000 MG tablet Take 1 tablet (1,000 mg total) by mouth 2 (two) times daily. Take for 5 days 08/25/22   Hermina Staggers, MD    Family History Family History  Problem Relation Age of Onset   Cancer Maternal  Grandmother        Breast   Diabetes Maternal Grandmother     Social History Social History   Tobacco Use   Smoking status: Some Days    Types: Cigarettes    Last attempt to quit: 11/09/2018    Years since quitting: 3.8   Smokeless tobacco: Never  Vaping Use   Vaping Use: Former  Substance Use Topics   Alcohol use: Yes    Alcohol/week: 2.0 standard drinks of alcohol    Types: 2 Shots of liquor per week    Comment: often WEEKENDS (05/30/18)   Drug use: Never     Allergies   Lactose intolerance (gi)   Review of Systems Review of Systems See HPI  Physical Exam Triage Vital Signs ED Triage Vitals [09/09/22 1443]  Enc Vitals Group     BP 122/83     Pulse Rate 86     Resp 17     Temp 98.2 F (36.8 C)     Temp Source Oral     SpO2 97 %     Weight      Height      Head Circumference      Peak Flow      Pain Score 0     Pain Loc      Pain Edu?      Excl. in [place]?    No data found.  Updated Vital Signs BP 122/83 (BP Location: Right Arm)   Pulse 86    Temp 98.2 F (36.8 C) (Oral)   Resp 17   LMP 08/19/2022 (Exact Date)   SpO2 97%   Physical Exam Constitutional:      General: She is not in acute distress.    Appearance: She is well-developed. She is ill-appearing.  HENT:     Head: Normocephalic and atraumatic.     Right Ear: Tympanic membrane normal.     Left Ear: Tympanic membrane normal.     Nose: Nose normal. No congestion.     Mouth/Throat:     Mouth: Mucous membranes are moist.     Pharynx: No posterior oropharyngeal erythema.  Eyes:     Conjunctiva/sclera: Conjunctivae normal.     Pupils: Pupils are equal, round, and reactive to light.  Cardiovascular:     Rate and Rhythm: Normal rate and regular rhythm.     Heart sounds: Normal heart sounds.  Pulmonary:     Effort: Pulmonary effort is normal. No respiratory distress.     Breath sounds: Normal breath sounds.  Abdominal:     General: There is no distension.     Palpations: Abdomen is soft.  Musculoskeletal:        General: Normal range of motion.     Cervical back: Normal range of motion.  Skin:    General: Skin is warm and dry.  Neurological:     Mental Status: She is alert.      UC Treatments / Results  Labs (all labs ordered are listed, but only abnormal results are displayed) Labs Reviewed  RESP PANEL BY RT-PCR (FLU A&B, COVID) [id number]    EKG   Radiology No results found.  Procedures Procedures (including critical care time)  Medications Ordered in UC Medications - No data to display  Initial Impression / Assessment and Plan / UC Course  I have reviewed the triage vital signs and the nursing notes.  Pertinent labs & imaging results that were available during my care of the patient were reviewed by  me and considered in my medical decision making (see chart for details).     Final Clinical Impressions(s) / UC Diagnoses   Final diagnoses:  Viral URI with cough     Discharge Instructions      Drink lots of fluids Take  over-the-counter cough and cold medicine as needed I have prescribed Tessalon to help with the cough Check MyChart for your test results We will call you if the COVID or influenza are positive   ED Prescriptions   None    PDMP not reviewed this encounter.   Raylene Everts, MD 09/09/22 [phone]

## 2022-09-09 NOTE — Discharge Instructions (Signed)
Drink lots of fluids Take over-the-counter cough and cold medicine as needed I have prescribed Tessalon to help with the cough Check MyChart for your test results We will call you if the COVID or influenza are positive

## 2022-09-09 NOTE — ED Triage Notes (Signed)
Pt c/o cough x 2 days. Started with lots of yellow thick mucous a couple days before. Cough worsening in last 24 hours. Denies fever. Daughter sick with flu 2 weeks ago, still slightly coughing. Taking mucinex prn, but has stopped due to ineffectiveness.

## 2022-09-10 ENCOUNTER — Telehealth: Payer: Self-pay | Admitting: Emergency Medicine

## 2022-09-10 NOTE — Telephone Encounter (Signed)
Message from [facility] asking for work excuse to be changed to return to work today since COVID/Flu testing came back negative. Note resent via My chart

## 2022-09-23 ENCOUNTER — Ambulatory Visit: Payer: BLUE CROSS/BLUE SHIELD | Admitting: Student

## 2022-09-28 ENCOUNTER — Ambulatory Visit
Admission: EM | Admit: 2022-09-28 | Discharge: 2022-09-28 | Disposition: A | Discharge status: Home / Self Care | Payer: BLUE CROSS/BLUE SHIELD | Attending: Family Medicine | Admitting: Family Medicine

## 2022-09-28 ENCOUNTER — Ambulatory Visit (INDEPENDENT_AMBULATORY_CARE_PROVIDER_SITE_OTHER): Payer: BLUE CROSS/BLUE SHIELD

## 2022-09-28 DIAGNOSIS — S99922A Unspecified injury of left foot, initial encounter: Secondary | ICD-10-CM

## 2022-09-28 DIAGNOSIS — S86002A Unspecified injury of left Achilles tendon, initial encounter: Secondary | ICD-10-CM

## 2022-09-28 DIAGNOSIS — M25572 Pain in left ankle and joints of left foot: Secondary | ICD-10-CM

## 2022-09-28 NOTE — ED Provider Notes (Signed)
[facility]    CSN: [id number] Arrival date & time: 09/28/22  1656      History   Chief Complaint Chief Complaint  Patient presents with   Ankle Injury    W/C; LT    HPI Kim Stanton is a 32 y.o. female.   HPI  Kim Stanton works for the [employer].  She had a tote sitting up on a shelf in the back of her truck.  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  It was full of packages.  She states that it tipped over and hit her in the back of the heel on her left heel.  She has bruising in the medial heel area.  She has pain with weightbearing.  Pain with ankle movement.  She is here for evaluation.  Injury happened today around 2.  It has been reported to her company  Past Medical History:  Diagnosis Date   Encounter for supervision of normal pregnancy, antepartum 12/21/2018    Nursing Staff Provider Office Location  [facility] Dating   LMP 10/10/2018 Language   English Anatomy US   11/25/2018 Flu Vaccine   Declined 12/21/2018 Genetic Screen  NIPS:   AFP:   First Screen:  Quad:   TDaP vaccine    Hgb A1C or  GTT Early  Third trimester  Rhogam     LAB RESULTS  Feeding Plan  Breast Blood Type --/--/AB POS Performed at [facility], 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 Authorizing Provider  amphetamine-dextroamphetamine (ADDERALL XR) 20 MG 24 hr  capsule Take 1 capsule (20 mg total) by mouth 2 (two) times daily. 08/23/22   Mozingo, Berdie Ogren, NP  amphetamine-dextroamphetamine (ADDERALL XR) 20 MG 24 hr capsule Take 1 capsule (20 mg total) by mouth 2 (two) times daily. 09/20/22   Mozingo, Berdie Ogren, NP  fluconazole (DIFLUCAN) 150 MG tablet Take 1 tablet (150 mg total) by mouth every 3 (three) days. 05/17/22   Teodora Medici, FNP  methocarbamol (ROBAXIN) 500 MG tablet Take 1 tablet (500 mg total) by mouth daily as needed for muscle spasms. 05/17/22   Teodora Medici, FNP  metroNIDAZOLE (FLAGYL) 500 MG tablet Take 1 tablet (500 mg total) by mouth 2 (two) times daily. 05/19/22   Chase Picket, MD  sertraline (ZOLOFT) 100 MG tablet TAKE 1 TABLET BY MOUTH EVERY DAY 09/23/21   Mozingo, Berdie Ogren, NP  sertraline (ZOLOFT) 50 MG tablet Take 1 tablet (50 mg total) by mouth daily. 04/16/22  Mozingo, Berdie Ogren, NP  valACYclovir (VALTREX) 1000 MG tablet Take 1 tablet (1,000 mg total) by mouth 2 (two) times daily. Take for 5 days 08/25/22   Chancy Milroy, MD    Family History Family History  Problem Relation Age of Onset   Cancer Maternal Grandmother        Breast   Diabetes Maternal Grandmother     Social History Social History   Tobacco Use   Smoking status: Some Days    Types: Cigarettes    Last attempt to quit: 11/09/2018    Years since quitting: 3.8   Smokeless tobacco: Never  Vaping Use   Vaping Use: Former  Substance Use Topics   Alcohol use: Yes    Alcohol/week: 2.0 standard drinks of alcohol    Types: 2 Shots of liquor per week    Comment: often WEEKENDS (05/30/18)   Drug use: Never     Allergies   Lactose intolerance (gi)   Review of Systems Review of Systems See HPI  Physical Exam Triage Vital Signs ED Triage Vitals [09/28/22 1716]  Enc Vitals Group     BP 128/87     Pulse Rate 80     Resp 17     Temp 98.5 F (36.9 C)     Temp Source Oral     SpO2 100 %     Weight      Height       Head Circumference      Peak Flow      Pain Score 4     Pain Loc      Pain Edu?      Excl. in [place]?    No data found.  Updated Vital Signs BP 128/87 (BP Location: Right Arm)   Pulse 80   Temp 98.5 F (36.9 C) (Oral)   Resp 17   SpO2 100%      Physical Exam Constitutional:      General: She is not in acute distress.    Appearance: She is well-developed.  HENT:     Head: Normocephalic and atraumatic.  Eyes:     Conjunctiva/sclera: Conjunctivae normal.     Pupils: Pupils are equal, round, and reactive to light.  Cardiovascular:     Rate and Rhythm: Normal rate.  Pulmonary:     Effort: Pulmonary effort is normal. No respiratory distress.  Musculoskeletal:        General: Tenderness and signs of injury present. Normal range of motion.     Cervical back: Normal range of motion.     Comments: Tenderness over the bruised area as pictured.  Mild tenderness over the distal of Achilles tendon and insertion.  Achilles tendon palpates intact.  Patient can stand on her toes.  Pain with dorsiflexion of foot  Skin:    General: Skin is warm and dry.  Neurological:     Mental Status: She is alert.     Gait: Gait abnormal.      UC Treatments / Results  Labs (all labs ordered are listed, but only abnormal results are displayed) Labs Reviewed - No data to display  EKG   Radiology DG Ankle Complete Left  Result Date: 09/28/2022 CLINICAL DATA:  Trauma to heel. EXAM: LEFT ANKLE COMPLETE - 3+ VIEW COMPARISON:  None Available. FINDINGS: Tiny plantar calcaneal heel spur. The ankle mortise is symmetric and intact. Mild lateral malleolar soft tissue swelling. No acute fracture is seen. No dislocation. IMPRESSION: Mild lateral malleolar soft tissue swelling. Tiny  plantar calcaneal heel spur. No acute fracture. Electronically Signed   By: Neita Garnet M.D.   On: 09/28/2022 18:12    Procedures Procedures (including critical care time)  Medications Ordered in UC Medications - No data to  display  Initial Impression / Assessment and Plan / UC Course  I have reviewed the triage vital signs and the nursing notes.  Pertinent labs & imaging results that were available during my care of the patient were reviewed by me and considered in my medical decision making (see chart for details).     He is a Teacher, adult education. injury.  I am going to place her on modified duty until she can see a Worker's Comp. provider tomorrow.  She can take over-the-counter ibuprofen.  Ice and elevation for pain Final Clinical Impressions(s) / UC Diagnoses   Final diagnoses:  Acute left ankle pain  Achilles tendon injury, left, initial encounter     Discharge Instructions      Walking while ankle is painful Take ibuprofen 800 mg 3 times a day as needed for pain Talk to your Worker's Comp. provider about additional evaluation Ice on area for 20 minutes every couple of hours     ED Prescriptions   None    PDMP not reviewed this encounter.   Eustace Moore, MD 09/28/22 2100

## 2022-09-28 NOTE — Discharge Instructions (Signed)
Walking while ankle is painful Take ibuprofen 800 mg 3 times a day as needed for pain Talk to your Worker's Comp. provider about additional evaluation Ice on area for 20 minutes every couple of hours

## 2022-09-28 NOTE — ED Triage Notes (Signed)
Pt c/o LT ankle/heel pain since earlier today when a tote at work was dropped on back of LT ankle. Tender to touch and bruising/swelling. Pain 4/10

## 2022-09-29 ENCOUNTER — Inpatient Hospital Stay: Admission: RE | Admit: 2022-09-29 | Payer: BLUE CROSS/BLUE SHIELD | Source: Ambulatory Visit

## 2022-10-09 ENCOUNTER — Other Ambulatory Visit: Payer: Self-pay | Admitting: Adult Health

## 2022-10-09 DIAGNOSIS — F331 Major depressive disorder, recurrent, moderate: Secondary | ICD-10-CM

## 2022-10-09 DIAGNOSIS — F411 Generalized anxiety disorder: Secondary | ICD-10-CM

## 2022-10-26 ENCOUNTER — Other Ambulatory Visit: Payer: Self-pay | Admitting: Adult Health

## 2022-10-26 DIAGNOSIS — F411 Generalized anxiety disorder: Secondary | ICD-10-CM

## 2022-10-26 DIAGNOSIS — F331 Major depressive disorder, recurrent, moderate: Secondary | ICD-10-CM

## 2022-11-01 ENCOUNTER — Ambulatory Visit: Payer: BLUE CROSS/BLUE SHIELD

## 2022-12-19 ENCOUNTER — Ambulatory Visit
Admission: RE | Admit: 2022-12-19 | Discharge: 2022-12-19 | Disposition: A | Discharge status: Home / Self Care | Payer: BLUE CROSS/BLUE SHIELD | Source: Ambulatory Visit

## 2022-12-19 ENCOUNTER — Ambulatory Visit: Payer: BLUE CROSS/BLUE SHIELD

## 2022-12-19 VITALS — BP 118/80 | HR 83 | Temp 97.9°F | Resp 20 | Ht 62.0 in | Wt 150.0 lb

## 2022-12-19 DIAGNOSIS — R52 Pain, unspecified: Secondary | ICD-10-CM | POA: Diagnosis not present

## 2022-12-19 DIAGNOSIS — J029 Acute pharyngitis, unspecified: Secondary | ICD-10-CM

## 2022-12-19 LAB — POCT RAPID STREP A (OFFICE): Rapid Strep A Screen: NEGATIVE

## 2022-12-19 LAB — POCT INFLUENZA A/B
Influenza A, POC: NEGATIVE
Influenza B, POC: NEGATIVE

## 2022-12-19 LAB — POC SARS CORONAVIRUS 2 AG -  ED: SARS Coronavirus 2 Ag: NEGATIVE

## 2022-12-19 NOTE — ED Provider Notes (Signed)
[name]    CSN: [id number] Arrival date & time: 12/19/22  1351      History   Chief Complaint Chief Complaint  Patient presents with   Nasal Congestion   Sore Throat   Generalized Body Aches    HPI Kim Stanton is a 32 y.o. female.   HPI 32 year old female presents with sore throat, chills, nasal congestion, and generalized bodyaches since last night/early this morning.  Reports has not done COVID-19 test.  Patient denies fever.  PMH significant for ADHD and depression.  Past Medical History:  Diagnosis Date   Encounter for supervision of normal pregnancy, antepartum 12/21/2018    Nursing Staff Provider Office Location  [facility] Dating   LMP 10/10/2018 Language   English Anatomy US   11/25/2018 Flu Vaccine   Declined 12/21/2018 Genetic Screen  NIPS:   AFP:   First Screen:  Quad:   TDaP vaccine    Hgb A1C or  GTT Early  Third trimester  Rhogam     LAB RESULTS  Feeding Plan  Breast Blood Type --/--/AB POS Performed at [facility], 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 Authorizing Provider  etonogestrel (NEXPLANON) 68 MG IMPL implant 1 each by Subdermal route once.   Yes [provider]  amphetamine-dextroamphetamine (ADDERALL XR) 20 MG 24 hr capsule Take 1 capsule (20 mg total) by mouth 2 (two) times daily. 08/23/22   Mozingo, Berdie Ogren, NP  amphetamine-dextroamphetamine (ADDERALL XR) 20 MG 24 hr capsule Take 1 capsule (20 mg total) by mouth 2 (two) times daily. 09/20/22   Mozingo, Berdie Ogren, NP  fluconazole (DIFLUCAN) 150 MG tablet Take 1 tablet (150 mg total) by mouth every 3 (three) days. 05/17/22   Teodora Medici, FNP  methocarbamol (ROBAXIN) 500 MG tablet Take 1 tablet (500 mg total) by mouth daily as needed for muscle spasms. 05/17/22   Teodora Medici, FNP  metroNIDAZOLE (FLAGYL) 500 MG tablet Take 1 tablet (500 mg total) by mouth 2 (two) times daily. 05/19/22   Chase Picket, MD  sertraline (ZOLOFT) 100 MG tablet TAKE 1 TABLET BY MOUTH EVERY DAY 09/23/21   Mozingo, Berdie Ogren, NP  sertraline (ZOLOFT) 50 MG tablet Take 1 tablet (50 mg total) by mouth daily. 04/16/22   Mozingo, Berdie Ogren, NP  valACYclovir (VALTREX) 1000 MG tablet Take 1 tablet (1,000 mg total) by mouth 2 (two) times daily. Take for 5 days 08/25/22   Chancy Milroy, MD    Family History Family History  Problem Relation Age of Onset   Healthy Mother    Healthy Father    Cancer Maternal Grandmother        Breast   Diabetes Maternal Grandmother     Social History Social History   Tobacco Use   Smoking status: Some Days    Types: Cigarettes    Last attempt to quit: 11/09/2018    Years since quitting: 4.1   Smokeless tobacco: Never  Vaping Use   Vaping Use: Former  Substance Use Topics   Alcohol use: Not Currently    Alcohol/week: 5.0 standard drinks of alcohol    Types: 5 Standard drinks or equivalent per week    Comment: weekly   Drug use: Never     Allergies   Lactose intolerance (gi)   Review of Systems Review of Systems  Constitutional:  Positive for chills.  HENT:  Positive for congestion and sore throat.      Physical Exam Triage Vital Signs ED Triage Vitals  Enc Vitals Group     BP 12/19/22 1418 118/80     Pulse Rate 12/19/22 1418 83     Resp 12/19/22 1418 20     Temp 12/19/22 1418 97.9 F  (36.6 C)     Temp Source 12/19/22 1418 Oral     SpO2 12/19/22 1418 98 %     Weight 12/19/22 1414 150 lb (68 kg)     Height 12/19/22 1414 5\' 2"  (1.575 m)     Head Circumference --      Peak Flow --      Pain Score 12/19/22 1414 8     Pain Loc --      Pain Edu? --      Excl. in [place]? --    No data found.  Updated Vital Signs BP 118/80   Pulse 83   Temp 97.9 F (36.6 C) (Oral)   Resp 20   Ht 5\' 2"  (1.575 m)   Wt 150 lb (68 kg)   LMP  (LMP Unknown)   SpO2 98%   BMI 27.44 kg/m       Physical Exam Vitals and nursing note reviewed.  Constitutional:      Appearance: Normal appearance. She is well-developed and normal weight. She is ill-appearing.  HENT:     Head: Normocephalic and atraumatic.     Right Ear: Tympanic membrane, ear canal and external ear normal.     Left Ear: Tympanic membrane, ear canal and external ear normal.     Mouth/Throat:     Mouth: Mucous membranes are moist.     Pharynx: Oropharynx is clear. Uvula midline.  Eyes:     Conjunctiva/sclera: Conjunctivae normal.     Pupils: Pupils are equal, round, and reactive to light.  Cardiovascular:     Rate and Rhythm: Normal rate and regular rhythm.     Pulses: Normal pulses.     Heart sounds: Normal heart sounds.  Pulmonary:     Effort: Pulmonary effort is normal.     Breath sounds: Normal breath sounds. No wheezing, rhonchi or rales.  Musculoskeletal:        General: Normal range of motion.     Cervical back: Normal range of motion and neck supple.  Skin:    General: Skin is warm and dry.  Neurological:     General: No focal deficit present.     Mental Status: She is alert and oriented to person, place, and time.  Psychiatric:  Mood and Affect: Mood normal.        Behavior: Behavior normal.      UC Treatments / Results  Labs (all labs ordered are listed, but only abnormal results are displayed) Labs Reviewed  POCT INFLUENZA A/B - Normal  POCT RAPID STREP A (OFFICE) - Normal  POC SARS  CORONAVIRUS 2 AG -  ED - Normal    EKG   Radiology No results found.  Procedures Procedures (including critical care time)  Medications Ordered in UC Medications - No data to display  Initial Impression / Assessment and Plan / UC Course  I have reviewed the triage vital signs and the nursing notes.  Pertinent labs & imaging results that were available during my care of the patient were reviewed by me and considered in my medical decision making (see chart for details).     MDM: 1.  Generalized body aches-POCT influenza and COVID-19 negative. Advised patient may take OTC Tylenol 1000 mg every 6 hours for body aches,  and/or fever.  2.  Sore throat-Advised may take OTC Ibuprofen 600 to 800 mg daily, as needed for sore throat pain.  Encouraged increase daily water intake to 64 ounces per day while taking these medications.  Patient discharged home, hemodynamically stable.  Work note for today provided to patient prior to discharge per patient request. Final Clinical Impressions(s) / UC Diagnoses   Final diagnoses:  Generalized body aches  Sore throat     Discharge Instructions      Advised patient may take OTC Tylenol 1000 mg every 6 hours for body aches,  and/or fever.  Advised may take OTC Ibuprofen 600 to 800 mg daily, as needed for sore throat pain.  Encouraged increase daily water intake to 64 ounces per day while taking these medications.     ED Prescriptions   None    PDMP not reviewed this encounter.   Eliezer Lofts, [place] 12/19/22 1517

## 2022-12-19 NOTE — ED Triage Notes (Signed)
Pt presents to Urgent Care with c/o sore throat since yesterday and chills, nasal congestion, and body aches since late last night/early this AM. Has not done COVID test.

## 2022-12-19 NOTE — Discharge Instructions (Addendum)
Advised patient may take OTC Tylenol 1000 mg every 6 hours for body aches,  and/or fever.  Advised may take OTC Ibuprofen 600 to 800 mg daily, as needed for sore throat pain.  Encouraged increase daily water intake to 64 ounces per day while taking these medications.

## 2023-01-03 ENCOUNTER — Ambulatory Visit
Admission: RE | Admit: 2023-01-03 | Discharge: 2023-01-03 | Disposition: A | Discharge status: Home / Self Care | Payer: BLUE CROSS/BLUE SHIELD | Source: Ambulatory Visit | Attending: Internal Medicine | Admitting: Internal Medicine

## 2023-01-03 ENCOUNTER — Ambulatory Visit (INDEPENDENT_AMBULATORY_CARE_PROVIDER_SITE_OTHER): Payer: BLUE CROSS/BLUE SHIELD

## 2023-01-03 VITALS — BP 113/78 | HR 79 | Temp 98.2°F | Resp 16

## 2023-01-03 DIAGNOSIS — J069 Acute upper respiratory infection, unspecified: Secondary | ICD-10-CM

## 2023-01-03 DIAGNOSIS — R053 Chronic cough: Secondary | ICD-10-CM

## 2023-01-03 MED ORDER — FLUCONAZOLE 150 MG PO TABS: 150.0000 mg | ORAL_TABLET | Freq: Every day | ORAL | 0 refills | Status: DC

## 2023-01-03 MED ORDER — PREDNISONE 20 MG PO TABS: 40.0000 mg | ORAL_TABLET | Freq: Every day | ORAL | 0 refills | Status: AC

## 2023-01-03 MED ORDER — AMOXICILLIN-POT CLAVULANATE 875-125 MG PO TABS: 1.0000 | ORAL_TABLET | Freq: Two times a day (BID) | ORAL | 0 refills | Status: DC

## 2023-01-03 NOTE — ED Triage Notes (Signed)
Pt c/o cough, nasal congestion with drainage,   Onset ~ 3 weeks   Has been evaluated for this and told viral URI should resolve. Pt concerned it is not resolving.

## 2023-01-03 NOTE — ED Provider Notes (Signed)
[name]    CSN: [id number] Arrival date & time: 01/03/23  1204      History   Chief Complaint Chief Complaint  Patient presents with   Cough    Congestion, my symptoms seem to be getting worse - Entered by patient    HPI Kim Stanton is a 32 y.o. female.   Patient presents with 2 to 3-week history of nasal congestion and cough.  Patient reports that her mucus in her nose has turned yellow and she is having a productive cough with yellow phlegm.  She denies any fevers.  Was seen in a different urgent care when symptoms first started and had negative strep and COVID testing.  She was advised to use supportive cares over-the-counter.  She has been taking Tylenol, allergy medication, Mucinex with no improvement in symptoms, and patient reports symptoms appear to be worsening.  She denies any history of asthma.  Patient not reporting chest pain or shortness of breath.   Cough   Past Medical History:  Diagnosis Date   Encounter for supervision of normal pregnancy, antepartum 12/21/2018    Nursing Staff Provider Office Location  [facility] Dating   LMP 10/10/2018 Language   English Anatomy US   11/25/2018 Flu Vaccine   Declined 12/21/2018 Genetic Screen  NIPS:   AFP:   First Screen:  Quad:   TDaP vaccine    Hgb A1C or  GTT Early  Third trimester  Rhogam     LAB RESULTS  Feeding Plan  Breast Blood Type --/--/AB POS Performed at [facility], 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 Authorizing Provider  amoxicillin-clavulanate (AUGMENTIN) 875-125 MG tablet Take 1 tablet by mouth every 12 (twelve) hours. 01/03/23  Yes Natalyah Cummiskey, Rolly Salter E, FNP  fluconazole (DIFLUCAN) 150 MG tablet Take 1 tablet (150 mg total) by mouth daily. Take at first sign of vaginal yeast. 01/03/23  Yes Elleah Hemsley, Acie Fredrickson, FNP  predniSONE (DELTASONE) 20 MG tablet Take 2 tablets (40 mg total) by mouth daily for 5 days. 01/03/23 01/08/23 Yes Annayah Worthley, Acie Fredrickson, FNP  amphetamine-dextroamphetamine (ADDERALL XR) 20 MG 24 hr capsule Take 1 capsule (20 mg total) by mouth 2 (two) times daily. 08/23/22   Mozingo, Thereasa Solo, NP  amphetamine-dextroamphetamine (ADDERALL XR) 20 MG 24 hr capsule Take 1 capsule (20 mg total) by mouth 2 (two) times daily. 09/20/22   Mozingo, Thereasa Solo, NP  etonogestrel (NEXPLANON) 68 MG IMPL implant 1 each by Subdermal route once.    [provider]  methocarbamol (ROBAXIN) 500 MG tablet Take 1 tablet (500 mg total) by mouth daily as needed for muscle spasms. 05/17/22   Gustavus Bryant, FNP  sertraline (ZOLOFT) 100 MG tablet TAKE 1 TABLET BY MOUTH EVERY DAY 09/23/21   Mozingo, Thereasa Solo, NP  sertraline (ZOLOFT) 50 MG tablet Take 1 tablet (50 mg total) by mouth daily. 04/16/22   Mozingo, Thereasa Solo, NP  valACYclovir (VALTREX) 1000 MG tablet Take 1 tablet (1,000 mg total) by mouth 2 (two) times daily. Take for 5 days 08/25/22   Hermina Staggers, MD    Family History Family History  Problem Relation Age of Onset   Healthy Mother    Healthy Father    Cancer Maternal Grandmother        Breast   Diabetes Maternal Grandmother     Social History Social History   Tobacco Use   Smoking status: Some Days    Types: Cigarettes    Last attempt to quit: 11/09/2018    Years since quitting: 4.1   Smokeless tobacco: Never  Vaping Use   Vaping Use:  Former  Substance Use Topics   Alcohol use: Not Currently    Alcohol/week: 5.0 standard drinks of alcohol    Types: 5 Standard drinks or equivalent per week    Comment: weekly   Drug use: Never     Allergies   Lactose intolerance (gi)   Review of Systems Review of Systems Per HPI  Physical Exam Triage Vital Signs ED Triage Vitals  Enc Vitals Group     BP 01/03/23 1221 113/78     Pulse Rate 01/03/23 1221 79     Resp 01/03/23 1221 16     Temp 01/03/23 1221 98.2 F (36.8 C)     Temp Source 01/03/23 1221 Oral     SpO2 01/03/23 1221 99 %     Weight --      Height --      Head Circumference --      Peak Flow --      Pain Score 01/03/23 1235 0     Pain Loc --      Pain Edu? --      Excl. in GC? --    No data found.  Updated Vital Signs BP 113/78 (BP Location: Left Arm)   Pulse 79   Temp 98.2 F (36.8 C) (Oral)   Resp 16   LMP  (LMP Unknown)   SpO2 99%   Breastfeeding No   Visual Acuity Right Eye Distance:   Left Eye Distance:   Bilateral Distance:    Right Eye Near:   Left Eye Near:    Bilateral Near:     Physical Exam Constitutional:      General: She is not in acute distress.    Appearance: Normal appearance. She is not toxic-appearing or diaphoretic.  HENT:     Head: Normocephalic and atraumatic.     Right Ear: Tympanic membrane and ear canal normal.     Left Ear: Tympanic membrane and ear canal normal.     Nose: Congestion present.     Mouth/Throat:     Mouth: Mucous membranes are moist.     Pharynx: Posterior oropharyngeal erythema present.  Eyes:     Extraocular Movements: Extraocular movements intact.     Conjunctiva/sclera: Conjunctivae normal.     Pupils: Pupils are equal, round, and reactive to light.  Cardiovascular:     Rate and Rhythm: Normal rate and regular rhythm.  Pulses: Normal pulses.     Heart sounds: Normal heart sounds.  Pulmonary:     Effort: Pulmonary effort is normal. No respiratory distress.     Breath sounds:  Normal breath sounds. No stridor. No wheezing, rhonchi or rales.  Abdominal:     General: Abdomen is flat. Bowel sounds are normal.     Palpations: Abdomen is soft.  Musculoskeletal:        General: Normal range of motion.     Cervical back: Normal range of motion.  Skin:    General: Skin is warm and dry.  Neurological:     General: No focal deficit present.     Mental Status: She is alert and oriented to person, place, and time. Mental status is at baseline.  Psychiatric:        Mood and Affect: Mood normal.        Behavior: Behavior normal.      UC Treatments / Results  Labs (all labs ordered are listed, but only abnormal results are displayed) Labs Reviewed - No data to display  EKG   Radiology DG Chest 2 View  Result Date: 01/03/2023 CLINICAL DATA:  Cough and congestion EXAM: CHEST - 2 VIEW COMPARISON:  Chest x-ray September 22, 2021 FINDINGS: The cardiomediastinal silhouette is unchanged in contour. No focal pulmonary opacity. No pleural effusion or pneumothorax. The visualized upper abdomen is unremarkable. No acute osseous abnormality. IMPRESSION: No active cardiopulmonary disease. Electronically Signed   By: Jacob Moores M.D.   On: 01/03/2023 13:09    Procedures Procedures (including critical care time)  Medications Ordered in UC Medications - No data to display  Initial Impression / Assessment and Plan / UC Course  I have reviewed the triage vital signs and the nursing notes.  Pertinent labs & imaging results that were available during my care of the patient were reviewed by me and considered in my medical decision making (see chart for details).     Patient's symptoms most likely started off as viral but given duration of symptoms and yellow mucus, I am concerned for secondary bacterial infection.  Chest x-ray was negative for any acute cardiopulmonary process.  Will treat with Augmentin antibiotic and prednisone which will help decrease inflammation and help  alleviate cough.  Patient requesting Diflucan given that antibiotics typically give her yeast infections.  Will prescribe 1 Diflucan pill for patient to take at first sign of vaginal yeast.  Patient was advised of supportive care and symptom management.  Advised strict return precautions.  Patient verbalized understanding and was agreeable with plan. Final Clinical Impressions(s) / UC Diagnoses   Final diagnoses:  Acute upper respiratory infection  Persistent cough     Discharge Instructions      Chest x-ray was normal.  I have prescribed you an antibiotic and steroid.  Diflucan has also been prescribed for you to take at first sign of vaginal yeast.  Follow-up if any symptoms persist or worsen.     ED Prescriptions     Medication Sig Dispense Auth. Provider   amoxicillin-clavulanate (AUGMENTIN) 875-125 MG tablet Take 1 tablet by mouth every 12 (twelve) hours. 14 tablet [place], [place] E, [place]   predniSONE (DELTASONE) 20 MG tablet Take 2 tablets (40 mg total) by mouth daily for 5 days. 10 tablet [place], [place] E, [place]   fluconazole (DIFLUCAN) 150 MG tablet Take 1 tablet (150 mg total) by mouth daily. Take at first sign of vaginal yeast. 1 tablet [place], Acie Fredrickson, [place]  PDMP not reviewed this encounter.   Gustavus BryantMound, Allisa Einspahr E, [place]FNP 01/03/23 [phone]

## 2023-01-03 NOTE — Discharge Instructions (Signed)
Chest x-ray was normal.  I have prescribed you an antibiotic and steroid.  Diflucan has also been prescribed for you to take at first sign of vaginal yeast.  Follow-up if any symptoms persist or worsen.

## 2023-02-07 ENCOUNTER — Ambulatory Visit: Payer: BLUE CROSS/BLUE SHIELD

## 2023-02-11 ENCOUNTER — Ambulatory Visit: Payer: BLUE CROSS/BLUE SHIELD

## 2023-02-12 ENCOUNTER — Ambulatory Visit
Admission: RE | Admit: 2023-02-12 | Discharge: 2023-02-12 | Disposition: A | Discharge status: Home / Self Care | Payer: BLUE CROSS/BLUE SHIELD | Source: Ambulatory Visit | Attending: Internal Medicine | Admitting: Internal Medicine

## 2023-02-12 ENCOUNTER — Ambulatory Visit: Payer: BLUE CROSS/BLUE SHIELD

## 2023-02-12 VITALS — BP 123/82 | HR 88 | Temp 99.2°F | Resp 16

## 2023-02-12 DIAGNOSIS — N921 Excessive and frequent menstruation with irregular cycle: Secondary | ICD-10-CM

## 2023-02-12 DIAGNOSIS — Z975 Presence of (intrauterine) contraceptive device: Secondary | ICD-10-CM | POA: Diagnosis present

## 2023-02-12 LAB — POCT URINALYSIS DIP (MANUAL ENTRY)
Bilirubin, UA: NEGATIVE
Glucose, UA: NEGATIVE mg/dL
Ketones, POC UA: NEGATIVE mg/dL
Leukocytes, UA: NEGATIVE
Nitrite, UA: NEGATIVE
Protein Ur, POC: NEGATIVE mg/dL
Spec Grav, UA: 1.02 (ref 1.010–1.025)
Urobilinogen, UA: 0.2 E.U./dL
pH, UA: 7 (ref 5.0–8.0)

## 2023-02-12 LAB — POCT URINE PREGNANCY: Preg Test, Ur: NEGATIVE

## 2023-02-12 NOTE — ED Provider Notes (Signed)
[facility]  Note:  This document was prepared using Dragon voice recognition software and may include unintentional dictation errors.  MRN: [id number] DOB: [birth year] DATE: 02/12/23   Subjective:  Chief Complaint:  Chief Complaint  Patient presents with   SEXUALLY TRANSMITTED DISEASE    Entered by patient     HPI: Kim Stanton is a 32 y.o. female presenting for vaginal bleeding for 1 month.  Patient states for the past month she has noticed vaginal bleeding when going to the restroom.  She states that she has not needed to wear a panty liner or tampon/pad.  The bleeding only appears to be occurring when she uses the restroom and she notices it when wiping or in the toilet.  She is requesting STD testing because she had a similar episode when she was pregnant and it was determined she had BV at that time.  She has irregular periods.  She cannot remember when her last period took place, but believes it was several months ago.  In the past, her menstrual cycles have been short and only spotting has occurred.  Her last menstrual cycle was heavier than normal and that caused her to have her Nexplanon replaced.  Per her chart, her Nexplanon was replaced in November 2023.  She reports no vaginal odor or vaginal discharge.  She is currently sexually active.  She states she recently had sex and caused bleeding to become worse.  She reports no pain with sex, but once.  Per chart, her last Pap smear was in 2020 and was unremarkable.  She does have a history of HSV.  Denies fever, nausea/vomiting, abdominal pain, vaginal discharge, vaginal odor, dysuria, low back pain. Endorses vaginal bleeding. Presents NAD.  Prior to Admission medications   Medication Sig Start Date End Date Taking? Authorizing Provider  amphetamine-dextroamphetamine (ADDERALL XR) 20 MG 24 hr capsule Take 1 capsule (20 mg total) by mouth 2 (two) times daily. 08/23/22   Mozingo, Thereasa Solo, NP  amphetamine-dextroamphetamine  (ADDERALL XR) 20 MG 24 hr capsule Take 1 capsule (20 mg total) by mouth 2 (two) times daily. 09/20/22   Mozingo, Thereasa Solo, NP  etonogestrel (NEXPLANON) 68 MG IMPL implant 1 each by Subdermal route once.    [provider]  methocarbamol (ROBAXIN) 500 MG tablet Take 1 tablet (500 mg total) by mouth daily as needed for muscle spasms. 05/17/22   Gustavus Bryant, FNP  sertraline (ZOLOFT) 100 MG tablet TAKE 1 TABLET BY MOUTH EVERY DAY 09/23/21   Mozingo, Thereasa Solo, NP  sertraline (ZOLOFT) 50 MG tablet Take 1 tablet (50 mg total) by mouth daily. 04/16/22   Mozingo, Thereasa Solo, NP  valACYclovir (VALTREX) 1000 MG tablet Take 1 tablet (1,000 mg total) by mouth 2 (two) times daily. Take for 5 days 08/25/22   Hermina Staggers, MD     Allergies  Allergen Reactions   Lactose Intolerance (Gi) Nausea Only and Other (See Comments)    Upsets the stomach    History:   Past Medical History:  Diagnosis Date   Encounter for supervision of normal pregnancy, antepartum 12/21/2018    Nursing Staff Provider Office Location  [facility] Dating   LMP 10/10/2018 Language   English Anatomy US   11/25/2018 Flu Vaccine   Declined 12/21/2018 Genetic Screen  NIPS:   AFP:   First Screen:  Quad:   TDaP vaccine    Hgb A1C or  GTT Early  Third trimester  Rhogam     LAB RESULTS  Feeding Plan  Breast Blood Type --/--/AB POS Performed at [facility], 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   Medical history non-contributory      Past Surgical History:  Procedure Laterality Date   NO PAST SURGERIES     WISDOM TOOTH EXTRACTION      Family History  Problem Relation Age of Onset   Healthy Mother    Healthy Father    Cancer Maternal Grandmother        Breast   Diabetes Maternal Grandmother     Social History   Tobacco Use   Smoking status: Some Days    Types: Cigarettes    Last attempt to quit: 11/09/2018    Years  since quitting: 4.2   Smokeless tobacco: Never  Vaping Use   Vaping Use: Former  Substance Use Topics   Alcohol use: Not Currently    Alcohol/week: 5.0 standard drinks of alcohol    Types: 5 Standard drinks or equivalent per week    Comment: weekly   Drug use: Never    Review of Systems  Constitutional:  Negative for fever.  Gastrointestinal:  Negative for abdominal pain, constipation, diarrhea, nausea and vomiting.  Genitourinary:  Positive for vaginal bleeding. Negative for dyspareunia, dysuria, flank pain, genital sores, hematuria, pelvic pain, vaginal discharge and vaginal pain.  Musculoskeletal:  Negative for back pain.     Objective:   Vitals: BP 123/82 (BP Location: Right Arm)   Pulse 88   Temp 99.2 F (37.3 C) (Oral)   Resp 16   SpO2 98%   Physical Exam Constitutional:      General: She is not in acute distress.    Appearance: Normal appearance. She is well-developed and overweight. She is not ill-appearing or toxic-appearing.  HENT:     Head: Normocephalic and atraumatic.  Cardiovascular:     Rate and Rhythm: Normal rate and regular rhythm.     Heart sounds: Normal heart sounds.  Pulmonary:     Effort: Pulmonary effort is normal.     Breath sounds: Normal breath sounds.     Comments: Clear to auscultation bilaterally  Abdominal:     General: Bowel sounds are normal.     Palpations: Abdomen is soft.     Tenderness: There is no abdominal tenderness. There is no right CVA tenderness or left CVA tenderness.  Skin:    General: Skin is warm and dry.  Neurological:     General: No focal deficit present.     Mental Status: She is alert.  Psychiatric:        Mood and Affect: Mood and affect normal.     Results:  Labs: Results for orders placed or performed during the hospital encounter of 02/12/23 (from the past 24 hour(s))  POCT urine pregnancy     Status: None   Collection Time: 02/12/23 12:03 PM  Result Value Ref Range   Preg Test, Ur Negative  Negative  POCT urinalysis dipstick     Status: Abnormal   Collection Time: 02/12/23 12:03 PM  Result Value Ref Range   Color, UA yellow yellow   Clarity, UA cloudy (A) clear   Glucose, UA negative negative mg/dL   Bilirubin, UA negative negative   Ketones, POC UA negative negative mg/dL   Spec Grav, UA 1.096 0.454 - 1.025   Blood, UA large (A) negative   pH, UA 7.0 5.0 - 8.0  Protein Ur, POC negative negative mg/dL   Urobilinogen, UA 0.2 0.2 or 1.0 E.U./dL   Nitrite, UA Negative Negative   Leukocytes, UA Negative Negative    Radiology: No results found.   UC Course/Treatments:  Procedures: Procedures   Medications Ordered in UC: Medications - No data to display   Assessment and Plan :     ICD-10-CM   1. Menorrhagia with irregular cycle  N92.1 Urine Culture    CBC with Differential/Platelet    Urine Culture    CBC with Differential/Platelet    2. Nexplanon in place  Z97.5      Menorrhagia with irregular cycle: Afebrile, nontoxic-appearing, NAD. VSS. DDX includes but not limited to: menorrhagia, endometriosis, BV, hematuria, nephrolithiasis, malignancy UA was positive for a large amount of blood today in office.  This could be from the vaginal bleeding or patient is possibly confusing vaginal bleeding with gross hematuria.  Urine culture is pending.  ABX not recommend at this time due to patient being asymptomatic.  Cytology is pending as well.  CBC was ordered to evaluate Hgb and platelets.  Recommend she follow-up with her GYN as soon as possible for further evaluation and Pap smear. Strict ED precautions were given and patient verbalized understanding.  Nexplanon in Place: Active Placed in November 2023.  Recommend follow-up with GYN as directed.    ED Discharge Orders     None        I have reviewed the PDMP during this encounter.     Cynda Acres, PA-C 02/12/23 1251

## 2023-02-12 NOTE — Discharge Instructions (Signed)
Your swab and urine were sent to the lab for further testing.  You will be called with results. You should avoid all sexual activity until you have been notified of all your results and have undergone any necessary treatment.  If you are positive, it is recommended that you inform all sexual partners so they can treat be treated as well before having sex again.   Your blood was sent to the lab for further testing as well. Someone from our office will call you with your results.  I recommend you contact your GYN as soon as possible for further evaluation and testing.

## 2023-02-12 NOTE — ED Triage Notes (Signed)
Pt reports vaginal bleeding x 1 month, no pain, no discharge, no abnormal odor, feels like vaginal dryness/irritation.  No abd/back pain, frequency or urgency.

## 2023-02-13 LAB — URINE CULTURE: Culture: NO GROWTH

## 2023-02-13 LAB — CBC WITH DIFFERENTIAL/PLATELET
Basophils Absolute: 0 10*3/uL (ref 0.0–0.2)
Basos: 1 %
EOS (ABSOLUTE): 0.1 10*3/uL (ref 0.0–0.4)
Eos: 1 %
Hematocrit: 45.3 % (ref 34.0–46.6)
Hemoglobin: 15.3 g/dL (ref 11.1–15.9)
Immature Grans (Abs): 0 10*3/uL (ref 0.0–0.1)
Immature Granulocytes: 0 %
Lymphocytes Absolute: 2.1 10*3/uL (ref 0.7–3.1)
Lymphs: 55 %
MCH: 32.6 pg (ref 26.6–33.0)
MCHC: 33.8 g/dL (ref 31.5–35.7)
MCV: 96 fL (ref 79–97)
Monocytes Absolute: 0.3 10*3/uL (ref 0.1–0.9)
Monocytes: 8 %
Neutrophils Absolute: 1.3 10*3/uL — ABNORMAL LOW (ref 1.4–7.0)
Neutrophils: 35 %
Platelets: 346 10*3/uL (ref 150–450)
RBC: 4.7 x10E6/uL (ref 3.77–5.28)
RDW: 12.3 % (ref 11.7–15.4)
WBC: 3.8 10*3/uL (ref 3.4–10.8)

## 2023-02-14 NOTE — Progress Notes (Signed)
Note sent to pt via MyChart

## 2023-02-15 LAB — CERVICOVAGINAL ANCILLARY ONLY
Bacterial Vaginitis (gardnerella): POSITIVE — AB
Candida Glabrata: NEGATIVE
Candida Vaginitis: NEGATIVE
Chlamydia: NEGATIVE
Comment: NEGATIVE
Comment: NEGATIVE
Comment: NEGATIVE
Comment: NEGATIVE
Comment: NEGATIVE
Comment: NORMAL
Neisseria Gonorrhea: NEGATIVE
Trichomonas: NEGATIVE

## 2023-02-16 ENCOUNTER — Telehealth (HOSPITAL_COMMUNITY): Payer: Self-pay | Admitting: Emergency Medicine

## 2023-02-16 MED ORDER — METRONIDAZOLE 0.75 % VA GEL: 1.0000 | Freq: Every day | VAGINAL | 0 refills | Status: AC

## 2023-02-16 NOTE — Telephone Encounter (Signed)
Metrogel for BV, see result

## 2023-02-25 ENCOUNTER — Ambulatory Visit
Admission: EM | Admit: 2023-02-25 | Discharge: 2023-02-25 | Disposition: A | Discharge status: Home / Self Care | Payer: BLUE CROSS/BLUE SHIELD | Attending: Internal Medicine | Admitting: Internal Medicine

## 2023-02-25 ENCOUNTER — Ambulatory Visit: Payer: BLUE CROSS/BLUE SHIELD

## 2023-02-25 DIAGNOSIS — K047 Periapical abscess without sinus: Secondary | ICD-10-CM | POA: Diagnosis not present

## 2023-02-25 DIAGNOSIS — K0889 Other specified disorders of teeth and supporting structures: Secondary | ICD-10-CM

## 2023-02-25 MED ORDER — AMOXICILLIN-POT CLAVULANATE 875-125 MG PO TABS: 1.0000 | ORAL_TABLET | Freq: Two times a day (BID) | ORAL | 0 refills | Status: AC

## 2023-02-25 NOTE — ED Triage Notes (Signed)
Pt reports lump above  right upper tooth for a few days that has drained and gum is now throbbing and sensitive to pressure and cold air. Home Remedies: Motrin at 10am.

## 2023-02-25 NOTE — Discharge Instructions (Addendum)
You were prescribed an antibiotic called Augmentin. This is often used to treat dental infections; however, this antibiotic will not take care of the dental problem alone. It is very important that you see a dentist as soon as possible. The infection will continue to reoccur if the problem is not fixed by a dentist. If not allergic, you may use over the counter ibuprofen or acetaminophen as needed for the pain.  

## 2023-02-25 NOTE — ED Provider Notes (Signed)
[facility]  Note:  This document was prepared using Dragon voice recognition software and may include unintentional dictation errors.  MRN: [id number] DOB: 1991-02-06 DATE: 02/25/23   Subjective:  Chief Complaint:  Chief Complaint  Patient presents with   Dental Pain    HPI: Kim Stanton is a 32 y.o. female presenting for toothache and possible infection for the past 2-3 days. Patient first noticed pain with eating on the right upper side of her month. Soon after, she noticed a lesion appear above her right upper tooth. She reports some discharge from the lesion. She now has a more persistent throbbing sensation that extends up her cheek. She had a cavity filled in the same tooth about 1 year ago. She took Motrin at 10am, but none since. Denies fever, nausea/vomiting, abdominal pain. Endorses toothache. Presents NAD.  Prior to Admission medications   Medication Sig Start Date End Date Taking? Authorizing Provider  amoxicillin-clavulanate (AUGMENTIN) 875-125 MG tablet Take 1 tablet by mouth every 12 (twelve) hours for 10 days. 02/25/23 03/07/23 Yes Murray Durrell P, PA-C  amphetamine-dextroamphetamine (ADDERALL XR) 20 MG 24 hr capsule Take 1 capsule (20 mg total) by mouth 2 (two) times daily. 08/23/22   Mozingo, Thereasa Solo, NP  amphetamine-dextroamphetamine (ADDERALL XR) 20 MG 24 hr capsule Take 1 capsule (20 mg total) by mouth 2 (two) times daily. 09/20/22   Mozingo, Thereasa Solo, NP  etonogestrel (NEXPLANON) 68 MG IMPL implant 1 each by Subdermal route once.    [provider]  methocarbamol (ROBAXIN) 500 MG tablet Take 1 tablet (500 mg total) by mouth daily as needed for muscle spasms. 05/17/22   Gustavus Bryant, FNP  sertraline (ZOLOFT) 100 MG tablet TAKE 1 TABLET BY MOUTH EVERY DAY 09/23/21   Mozingo, Thereasa Solo, NP  sertraline (ZOLOFT) 50 MG tablet Take 1 tablet (50 mg total) by mouth daily. 04/16/22   Mozingo, Thereasa Solo, NP  valACYclovir (VALTREX)  1000 MG tablet Take 1 tablet (1,000 mg total) by mouth 2 (two) times daily. Take for 5 days 08/25/22   Hermina Staggers, MD     Allergies  Allergen Reactions   Lactose Intolerance (Gi) Nausea Only and Other (See Comments)    Upsets the stomach    History:   Past Medical History:  Diagnosis Date   Encounter for supervision of normal pregnancy, antepartum 12/21/2018    Nursing Staff Provider Office Location  [facility] Dating   LMP 10/10/2018 Language   English Anatomy US   11/25/2018 Flu Vaccine   Declined 12/21/2018 Genetic Screen  NIPS:   AFP:   First Screen:  Quad:   TDaP vaccine    Hgb A1C or  GTT Early  Third trimester  Rhogam     LAB RESULTS  Feeding Plan  Breast Blood Type --/--/AB POS Performed at [facility], 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   Medical history non-contributory      Past Surgical History:  Procedure Laterality Date   NO PAST SURGERIES     WISDOM TOOTH EXTRACTION      Family History  Problem Relation Age of Onset   Healthy Mother    Healthy Father    Cancer Maternal Grandmother        Breast   Diabetes Maternal Grandmother     Social History   Tobacco Use   Smoking status: Some Days    Types: Cigarettes  Last attempt to quit: 11/09/2018    Years since quitting: 4.2   Smokeless tobacco: Never  Vaping Use   Vaping Use: Former  Substance Use Topics   Alcohol use: Not Currently    Alcohol/week: 5.0 standard drinks of alcohol    Types: 5 Standard drinks or equivalent per week    Comment: weekly   Drug use: Never    Review of Systems  Constitutional:  Negative for fever.  HENT:  Positive for dental problem.   Gastrointestinal:  Negative for abdominal pain, nausea and vomiting.  All other systems reviewed and are negative.    Objective:   Vitals: BP 117/78 (BP Location: Right Arm)   Pulse 86   Temp 98.4 F (36.9 C) (Oral)   Resp 18   SpO2 98%    Physical Exam Constitutional:      General: She is not in acute distress.    Appearance: Normal appearance. She is well-developed and normal weight. She is not ill-appearing or toxic-appearing.  HENT:     Head: Normocephalic and atraumatic.     Mouth/Throat:     Mouth: Oral lesions present.     Dentition: Dental tenderness and gum lesions present.     Comments: Patient has pain and TTP at right upper cuspid. There is a lesion on the upper gum above the tooth. No discharge noted on exam.  Cardiovascular:     Rate and Rhythm: Normal rate and regular rhythm.     Heart sounds: Normal heart sounds.  Pulmonary:     Effort: Pulmonary effort is normal.     Breath sounds: Normal breath sounds.     Comments: Clear to auscultation bilaterally  Abdominal:     General: Bowel sounds are normal.     Palpations: Abdomen is soft.     Tenderness: There is no abdominal tenderness.  Skin:    General: Skin is warm and dry.  Neurological:     General: No focal deficit present.     Mental Status: She is alert.  Psychiatric:        Mood and Affect: Mood and affect normal.     Results:  Labs: No results found for this or any previous visit (from the past 24 hour(s)).  Radiology: No results found.   UC Course/Treatments:  Procedures: Procedures   Medications Ordered in UC: Medications - No data to display   Assessment and Plan :     ICD-10-CM   1. Dental infection  K04.7     2. Pain, dental  K08.89      Dental infection: Afebrile, nontoxic-appearing, NAD. VSS. DDX includes but not limited to: dental abscess, cavity, aphthous ulcer On exam, oral lesion noted above the right upper cuspid. Given prior toothache to onset suspect dental abscess versus aphthous ulcer. Augmentin 875mg  BID was prescribed.  Analgesics as needed for pain.  Patient directed to follow-up with a dentist soon as possible. She states she has an appointment in 2 weeks, but will try to find one sooner here in the  area. Strict ED precautions were given and patient verbalized understanding.  Pain, dental Afebrile, nontoxic-appearing, NAD. VSS. DDX includes but not limited to: dental abscess, cavity, aphthous ulcer On exam, oral lesion noted above the right upper cuspid. Given prior toothache to onset suspect dental abscess versus aphthous ulcer. Augmentin 875mg  BID was prescribed.  Analgesics as needed for pain.  Patient directed to follow-up with a dentist soon as possible. She states she has an appointment in 2  weeks, but will try to find one sooner here in the area. Strict ED precautions were given and patient verbalized understanding.    ED Discharge Orders          Ordered    amoxicillin-clavulanate (AUGMENTIN) 875-125 MG tablet  Every 12 hours        02/25/23 1602             I have reviewed the PDMP during this encounter.     Cynda Acres, PA-C 02/25/23 1611

## 2023-02-26 ENCOUNTER — Telehealth: Payer: Self-pay

## 2023-02-26 NOTE — Telephone Encounter (Signed)
TC to pt, ID verified for status check. Pt states she picked up Rx and sxs are improving. Pt request work note to return to work 02/27/23 instead of 02/28/23. Work note generated and sent to pt via MyChart. Pt agrees and denies any add'l concerns at this time.

## 2023-03-02 ENCOUNTER — Encounter: Payer: Self-pay | Admitting: Adult Health

## 2023-03-02 ENCOUNTER — Telehealth (INDEPENDENT_AMBULATORY_CARE_PROVIDER_SITE_OTHER): Payer: BLUE CROSS/BLUE SHIELD | Admitting: Adult Health

## 2023-03-02 DIAGNOSIS — F909 Attention-deficit hyperactivity disorder, unspecified type: Secondary | ICD-10-CM | POA: Diagnosis not present

## 2023-03-02 DIAGNOSIS — F331 Major depressive disorder, recurrent, moderate: Secondary | ICD-10-CM

## 2023-03-02 DIAGNOSIS — F411 Generalized anxiety disorder: Secondary | ICD-10-CM

## 2023-03-02 MED ORDER — AMPHETAMINE-DEXTROAMPHET ER 20 MG PO CP24: 20.0000 mg | ORAL_CAPSULE | Freq: Two times a day (BID) | ORAL | 0 refills | Status: DC

## 2023-03-02 MED ORDER — AMPHETAMINE-DEXTROAMPHET ER 20 MG PO CP24
20.0000 mg | ORAL_CAPSULE | Freq: Two times a day (BID) | ORAL | 0 refills | Status: DC
Start: 2023-04-27 — End: 2023-10-12

## 2023-03-02 MED ORDER — AMPHETAMINE-DEXTROAMPHET ER 20 MG PO CP24
20.0000 mg | ORAL_CAPSULE | Freq: Two times a day (BID) | ORAL | 0 refills | Status: DC
Start: 2023-03-30 — End: 2023-10-06

## 2023-03-02 MED ORDER — SERTRALINE HCL 50 MG PO TABS
50.0000 mg | ORAL_TABLET | Freq: Every day | ORAL | 1 refills | Status: DC
Start: 2023-03-02 — End: 2023-10-06

## 2023-03-02 NOTE — Progress Notes (Signed)
Kim Stanton [id number] 1990/10/24 32 y.o.  Virtual Visit via Video Note  I connected with pt @ on 03/02/23 at  3:40 PM EDT by a video enabled telemedicine application and verified that I am speaking with the correct person using two identifiers.   I discussed the limitations of evaluation and management by telemedicine and the availability of in person appointments. The patient expressed understanding and agreed to proceed.  I discussed the assessment and treatment plan with the patient. The patient was provided an opportunity to ask questions and all were answered. The patient agreed with the plan and demonstrated an understanding of the instructions.   The patient was advised to call back or seek an in-person evaluation if the symptoms worsen or if the condition fails to improve as anticipated.  I provided 15 minutes of non-face-to-face time during this encounter.  The patient was located at home.  The provider was located at [facility].   Dorothyann Gibbs, NP   Subjective:   Patient ID:  Kim Stanton is a 32 y.o. (DOB 1991-08-22) female.  Chief Complaint: No chief complaint on file.   HPI Kim Stanton presents for follow-up of MDD, GAD and ADHD.   Describes mood today as "ok". Pleasant. Tearful at times. Mood symptoms - reports anxiety, depression, ad irritability at times. Reports some worry, rumination, and over thinking. Mood is consistent. Stating "I feel like I'm doing ok". Restarted Zoloft at 50mg  and feels it has been helpful. Feels like the Adderall works well. She and daughter doing well. Stable interest and motivation. Taking medications as prescribed.  Energy levels stable. Active, has a regular exercise routine. Walking 13 miles a day. Enjoys some usual interests and activities. Single. Lives with daughter, age 73. Mother local. Spending time with family. Appetite adequate. Weight loss - 140 pounds. Sleeps well most nights. Averages 6 hours. Focus and  concentration difficulties. Completing tasks. Managing aspects of household. 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   Denies SI or HI.  Denies AH or VH.  Previous medication trial: Adderall XR   Review of Systems:  Review of Systems  Musculoskeletal:  Negative for gait problem.  Neurological:  Negative for tremors.  Psychiatric/Behavioral:         Please refer to HPI    Medications: I have reviewed the patient's current medications.  Current Outpatient Medications  Medication Sig Dispense Refill   amoxicillin-clavulanate (AUGMENTIN) 875-125 MG tablet Take 1 tablet by mouth every 12 (twelve) hours for 10 days. 20 tablet 0   amphetamine-dextroamphetamine (ADDERALL XR) 20 MG 24 hr capsule Take 1 capsule (20 mg total) by mouth 2 (two) times daily. 60 capsule 0   amphetamine-dextroamphetamine (ADDERALL XR) 20 MG 24 hr capsule Take 1 capsule (20 mg total) by mouth 2 (two) times daily. 60 capsule 0   etonogestrel (NEXPLANON) 68 MG IMPL implant 1 each by Subdermal route once.     methocarbamol (ROBAXIN) 500 MG tablet Take 1 tablet (500 mg total) by mouth daily as needed for muscle spasms. 20 tablet 0   sertraline (ZOLOFT) 100 MG tablet TAKE 1 TABLET BY MOUTH EVERY DAY 90 tablet 0   sertraline (ZOLOFT) 50 MG tablet Take 1 tablet (50 mg total) by mouth daily. 90 tablet 1   valACYclovir (VALTREX) 1000 MG tablet Take 1 tablet (1,000 mg total) by mouth 2 (two) times daily. Take for 5 days 30 tablet PRN   No current facility-administered medications for this visit.    Medication  Side Effects: None  Allergies:  Allergies  Allergen Reactions   Lactose Intolerance (Gi) Nausea Only and Other (See Comments)    Upsets the stomach    Past Medical History:  Diagnosis Date   Encounter for supervision of normal pregnancy, antepartum 12/21/2018    Nursing Staff Provider Office Location  [facility] Dating   LMP 10/10/2018 Language   English Anatomy US   11/25/2018 Flu Vaccine   Declined 12/21/2018 Genetic  Screen  NIPS:   AFP:   First Screen:  Quad:   TDaP vaccine    Hgb A1C or  GTT Early  Third trimester  Rhogam     LAB RESULTS  Feeding Plan  Breast Blood Type --/--/AB POS Performed at [facility], 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   Medical history non-contributory     Family History  Problem Relation Age of Onset   Healthy Mother    Healthy Father    Cancer Maternal Grandmother        Breast   Diabetes Maternal Grandmother     Social History   Socioeconomic History   Marital status: Single    Spouse name: Not on file   Number of children: Not on file   Years of education: Not on file   Highest education level: Not on file  Occupational History   Not on file  Tobacco Use   Smoking status: Some Days    Types: Cigarettes    Last attempt to quit: 11/09/2018    Years since quitting: 4.3   Smokeless tobacco: Never  Vaping Use   Vaping Use: Former  Substance and Sexual Activity   Alcohol use: Not Currently    Alcohol/week: 5.0 standard drinks of alcohol    Types: 5 Standard drinks or equivalent per week    Comment: weekly   Drug use: Never   Sexual activity: Yes    Partners: Male  Other Topics Concern   Not on file  Social History Narrative   Not on file   Social Determinants of Health   Financial Resource Strain: Not on file  Food Insecurity: Not on file  Transportation Needs: Not on file  Physical Activity: Not on file  Stress: Not on file  Social Connections: Not on file  Intimate Partner Violence: Not on file    Past Medical History, Surgical history, Social history, and Family history were reviewed and updated as appropriate.   Please see review of systems for further details on the patient's review from today.   Objective:   Physical Exam:  There were no vitals taken for this visit.  Physical Exam Constitutional:      General: She is not in acute  distress. Musculoskeletal:        General: No deformity.  Neurological:     Mental Status: She is alert and oriented to person, place, and time.     Coordination: Coordination normal.  Psychiatric:        Attention and Perception: Attention and perception normal. She does not perceive auditory or visual hallucinations.        Mood and Affect: Mood normal. Mood is not anxious or depressed. Affect is not labile, blunt, angry or inappropriate.        Speech: Speech normal.        Behavior: Behavior normal.        Thought Content: Thought content normal. Thought content is  not paranoid or delusional. Thought content does not include homicidal or suicidal ideation. Thought content does not include homicidal or suicidal plan.        Cognition and Memory: Cognition and memory normal.        Judgment: Judgment normal.     Comments: Insight intact     Lab Review:     Component Value Date/Time   NA 134 (L) 07/16/2019 1950   NA 137 06/15/2019 1047   K 4.0 07/16/2019 1950   CL 104 07/16/2019 1950   CO2 19 (L) 07/16/2019 1950   GLUCOSE 91 07/16/2019 1950   BUN 14 07/16/2019 1950   BUN 5 (L) 06/15/2019 1047   CREATININE 0.69 07/16/2019 1950   CALCIUM 9.4 07/16/2019 1950   PROT 6.5 07/16/2019 1950   PROT 6.3 06/15/2019 1047   ALBUMIN 3.4 (L) 07/16/2019 1950   ALBUMIN 3.9 06/15/2019 1047   AST 25 07/16/2019 1950   ALT 13 07/16/2019 1950   ALKPHOS 196 (H) 07/16/2019 1950   BILITOT 0.4 07/16/2019 1950   BILITOT <0.2 06/15/2019 1047   GFRNONAA >60 07/16/2019 1950   GFRAA >60 07/16/2019 1950       Component Value Date/Time   WBC 3.8 02/12/2023 1223   WBC 7.0 07/16/2019 1923   RBC 4.70 02/12/2023 1223   RBC 4.27 07/16/2019 1923   HGB 15.3 02/12/2023 1223   HCT 45.3 02/12/2023 1223   PLT 346 02/12/2023 1223   MCV 96 02/12/2023 1223   MCH 32.6 02/12/2023 1223   MCH 30.0 07/16/2019 1923   MCHC 33.8 02/12/2023 1223   MCHC 33.3 07/16/2019 1923   RDW 12.3 02/12/2023 1223   LYMPHSABS  2.1 02/12/2023 1223   MONOABS 0.5 05/24/2018 1249   EOSABS 0.1 02/12/2023 1223   BASOSABS 0.0 02/12/2023 1223    No results found for: "POCLITH", "LITHIUM"   No results found for: "PHENYTOIN", "PHENOBARB", "VALPROATE", "CBMZ"   .res Assessment: Plan:    Plan:  PDMP reviewed  1. Adderall XR 20mg  daily to BID 2. Zoloft 50mg  daily   Monitor BP between visits.  Read and reviewed note with patient for accuracy.   RTC 3 months  Patient advised to contact office with any questions, adverse effects, or acute worsening in signs and symptoms.  Discussed potential benefits, risks, and side effects of stimulants with patient to include increased heart rate, palpitations, insomnia, increased anxiety, increased irritability, or decreased appetite.  Instructed patient to contact office if experiencing any significant tolerability issues. Diagnoses and all orders for this visit:  Attention deficit hyperactivity disorder (ADHD), unspecified ADHD type     Please see After Visit Summary for patient specific instructions.  Future Appointments  Date Time Provider Department Center  03/02/2023  3:40 PM Vidyuth Belsito, Thereasa Solo, NP [facility] None    No orders of the defined types were placed in this encounter.     -------------------------------

## 2023-03-07 ENCOUNTER — Telehealth: Payer: Self-pay | Admitting: Adult Health

## 2023-03-07 DIAGNOSIS — Z0289 Encounter for other administrative examinations: Secondary | ICD-10-CM

## 2023-03-07 NOTE — Telephone Encounter (Signed)
Pateint came into office about changing her anxiety medication. She would like to try something different if possible she is currently taking Setraline 50mg . Pls call to discuss Ph: [phone]

## 2023-03-07 NOTE — Telephone Encounter (Signed)
Please see message. Patient just seen 6/5. She is taking sertraline and Adderall.

## 2023-03-07 NOTE — Telephone Encounter (Signed)
Received FMLA paperwork. Given to [facility] 6/10

## 2023-03-08 NOTE — Telephone Encounter (Signed)
Can we get her an appointment

## 2023-03-09 NOTE — Telephone Encounter (Signed)
Please call to schedule an appt with Kim Stanton.  

## 2023-03-09 NOTE — Telephone Encounter (Signed)
Patient scheduled 6/18

## 2023-03-09 NOTE — Telephone Encounter (Signed)
Patient notified that Almira Coaster will discuss this in the office at appt next week.

## 2023-03-11 ENCOUNTER — Telehealth: Payer: Self-pay | Admitting: Adult Health

## 2023-03-11 NOTE — Telephone Encounter (Signed)
Pt called at 11:35a.  She is asking about the paperwork being filled out for her FMLA.  Originally she said it was supposed to be 40 hours per month, but she wants to know if you can change it to 60 hours per month. Her union steward recommended that.  She would like to pick the paperwork up on Tues, Jun 18 at her appt with Almira Coaster, since they don't have a fax number to send the paperwork to.  Next appt 6/18

## 2023-03-11 NOTE — Telephone Encounter (Signed)
Please see message from patient regarding FMLA.

## 2023-03-11 NOTE — Telephone Encounter (Signed)
Noted I haven't completed her paper work yet but will get that done before her apt

## 2023-03-15 ENCOUNTER — Ambulatory Visit (INDEPENDENT_AMBULATORY_CARE_PROVIDER_SITE_OTHER): Payer: Self-pay | Admitting: Adult Health

## 2023-03-15 DIAGNOSIS — Z0389 Encounter for observation for other suspected diseases and conditions ruled out: Secondary | ICD-10-CM

## 2023-03-15 NOTE — Telephone Encounter (Signed)
completed

## 2023-03-15 NOTE — Progress Notes (Signed)
Patient no show appointment. ? ?

## 2023-03-15 NOTE — Telephone Encounter (Signed)
Paper work completed and given to Chauncey to review and sign during her apt this morning

## 2023-03-21 ENCOUNTER — Ambulatory Visit (INDEPENDENT_AMBULATORY_CARE_PROVIDER_SITE_OTHER): Payer: Self-pay | Admitting: Adult Health

## 2023-03-21 ENCOUNTER — Ambulatory Visit: Payer: BLUE CROSS/BLUE SHIELD

## 2023-03-21 DIAGNOSIS — Z0389 Encounter for observation for other suspected diseases and conditions ruled out: Secondary | ICD-10-CM

## 2023-03-21 NOTE — Progress Notes (Signed)
Patient no show appointment. ? ?

## 2023-06-21 ENCOUNTER — Ambulatory Visit
Admission: RE | Admit: 2023-06-21 | Discharge: 2023-06-21 | Disposition: A | Discharge status: Home / Self Care | Payer: BLUE CROSS/BLUE SHIELD | Source: Ambulatory Visit | Attending: Emergency Medicine | Admitting: Emergency Medicine

## 2023-06-21 VITALS — BP 113/77 | HR 83 | Temp 98.1°F | Resp 18

## 2023-06-21 DIAGNOSIS — M5410 Radiculopathy, site unspecified: Secondary | ICD-10-CM

## 2023-06-21 DIAGNOSIS — M62830 Muscle spasm of back: Secondary | ICD-10-CM

## 2023-06-21 DIAGNOSIS — M62838 Other muscle spasm: Secondary | ICD-10-CM

## 2023-06-21 MED ORDER — BACLOFEN 10 MG PO TABS: 10.0000 mg | ORAL_TABLET | Freq: Three times a day (TID) | ORAL | 0 refills | Status: AC

## 2023-06-21 MED ORDER — METHYLPREDNISOLONE 4 MG PO TBPK: ORAL_TABLET | ORAL | 0 refills | Status: DC

## 2023-06-21 MED ORDER — ACETAMINOPHEN 500 MG PO TABS: 1000.0000 mg | ORAL_TABLET | Freq: Three times a day (TID) | ORAL | 0 refills | Status: AC

## 2023-06-21 NOTE — ED Provider Notes (Signed)
[name]    CSN: [id number] Arrival date & time: 06/21/23  8295    HISTORY   Chief Complaint  Patient presents with   Back Pain    Extreme back pains, side pain, headache and tingling in my arms - Entered by patient   HPI Kim Stanton is a pleasant, 32 y.o. female who presents to urgent care today. Patient complains of bilateral lower back pain that radiates to her buttocks and somewhat down her thighs, right greater than left for the past 3 days.  Patient also complains of intermittent episodes of her thumbs, index and middle fingers tingling like they are "falling asleep", states she is also been having headache on the back of her neck that radiates over the top of her head to the back of her eyelids.  Patient states that the past 2 mornings, she has had swelling of her eyelids, today is better than it was yesterday.  Patient states that her lower back pain and numbness/tingling in her fingers has been keeping her awake at night.  Patient states last night she opted to sleep on her daughter's memory foam mattress instead of her own pillow top, states this helped her get more comfortable and she feels like she slept better.  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  Patient states she normally gets in and out of her truck but for the past few weeks has been trying to walk more.  Patient denies any other changes in physical activity or recent strenuous activity.  EMR reviewed by me, patient has had neck pain in the past, CT scan of neck performed in 2019 revealed abnormal lordosis of her cervical spine, likely due to muscle spasm.  Patient states she has been taking Tylenol and trying to "stretch it out" without relief.  The history is provided by the patient.   Past Medical History:  Diagnosis Date   Encounter for supervision of normal pregnancy, antepartum 12/21/2018    Nursing Staff Provider Office Location  [facility] Dating    LMP 10/10/2018 Language   English Anatomy US   11/25/2018 Flu Vaccine   Declined 12/21/2018 Genetic Screen  NIPS:   AFP:   First Screen:  Quad:   TDaP vaccine    Hgb A1C or  GTT Early  Third trimester  Rhogam     LAB RESULTS  Feeding Plan  Breast Blood Type --/--/AB POS Performed at [facility], 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 Authorizing Provider  acetaminophen (  TYLENOL) 500 MG tablet Take 2 tablets (1,000 mg total) by mouth every 8 (eight) hours. 06/21/23 07/21/23 Yes Theadora Rama Scales, PA-C  baclofen (LIORESAL) 10 MG tablet Take 1 tablet (10 mg total) by mouth 3 (three) times daily for 7 days. 06/21/23 06/28/23 Yes Theadora Rama Scales, PA-C  methylPREDNISolone (MEDROL DOSEPAK) 4 MG TBPK tablet Take 24 mg on day 1, 20 mg on day 2, 16 mg on day 3, 12 mg on day 4, 8 mg on day 5, 4 mg on day 6.  Take all tablets in each row at once, do not spread tablets out throughout the day. 06/21/23  Yes Theadora Rama Scales, PA-C  amphetamine-dextroamphetamine (ADDERALL XR) 20 MG 24 hr capsule Take 1 capsule (20 mg total) by mouth 2 (two) times daily. 03/02/23   Mozingo, Thereasa Solo, NP  amphetamine-dextroamphetamine (ADDERALL XR) 20 MG 24 hr capsule Take 1 capsule (20 mg total) by mouth 2 (two) times daily. 03/30/23   Mozingo, Thereasa Solo, NP  amphetamine-dextroamphetamine (ADDERALL  XR) 20 MG 24 hr capsule Take 1 capsule (20 mg total) by mouth 2 (two) times daily. 04/27/23   Mozingo, Thereasa Solo, NP  etonogestrel (NEXPLANON) 68 MG IMPL implant 1 each by Subdermal route once.    [provider]  methocarbamol (ROBAXIN) 500 MG tablet Take 1 tablet (500 mg total) by mouth daily as needed for muscle spasms. 05/17/22   Gustavus Bryant, FNP  sertraline (ZOLOFT) 50 MG tablet Take 1 tablet (50 mg total) by mouth daily. 03/02/23   Mozingo, Thereasa Solo, NP  valACYclovir (VALTREX) 1000 MG tablet Take 1 tablet (1,000 mg total) by mouth 2 (two) times daily. Take for 5 days 08/25/22   Hermina Staggers, MD    Family History Family History  Problem Relation Age of Onset   Healthy Mother    Healthy Father    Cancer Maternal Grandmother        Breast   Diabetes Maternal Grandmother    Social History Social History   Tobacco Use   Smoking status: Some Days    Current packs/day: 0.00    Types: Cigarettes    Last attempt to quit: 11/09/2018    Years since quitting: 4.6   Smokeless tobacco: Never  Vaping Use   Vaping status: Former  Substance Use Topics   Alcohol use: Not Currently    Alcohol/week: 5.0 standard drinks of alcohol    Types: 5 Standard drinks or equivalent per week    Comment: weekly   Drug use: Never   Allergies   Lactose intolerance (gi)  Review of Systems Review of Systems Pertinent findings revealed after performing a 14 point review of systems has been noted in the history of present illness.  Physical Exam Vital Signs BP 113/77 (BP Location: Right Arm)   Pulse 83   Temp 98.1 F (36.7 C) (Oral)   Resp 18   SpO2 97%   No data found.  Physical Exam Vitals and nursing note reviewed.  Constitutional:      General: She is not in acute distress.    Appearance: Normal appearance.  HENT:     Head: Normocephalic and atraumatic.  Eyes:     Pupils: Pupils are equal, round, and reactive to light.  Cardiovascular:     Rate and Rhythm:  Normal rate and regular rhythm.  Pulmonary:     Effort: Pulmonary effort is normal.     Breath sounds: Normal breath sounds.  Musculoskeletal:     Cervical back:  Normal range of motion and neck supple. Spasms and tenderness present. No swelling, edema, bony tenderness or crepitus. No pain with movement. Normal range of motion.     Thoracic back: Normal.     Lumbar back: Spasms and tenderness present. No swelling, edema or bony tenderness. Normal range of motion. Positive right straight leg raise test and positive left straight leg raise test.  Skin:    General: Skin is warm and dry.  Neurological:     General: No focal deficit present.     Mental Status: She is alert and oriented to person, place, and time. Mental status is at baseline.  Psychiatric:        Mood and Affect: Mood normal.        Behavior: Behavior normal.        Thought Content: Thought content normal.        Judgment: Judgment normal.     UC Couse / Diagnostics / Procedures:     Radiology No results found.  Procedures Procedures (including critical care time) EKG  Pending results:  Labs Reviewed - No data to display  Medications Ordered in UC: Medications - No data to display  UC Diagnoses / Final Clinical Impressions(s)   I have reviewed the triage vital signs and the nursing notes.  Pertinent labs & imaging results that were available during my care of the patient were reviewed by me and considered in my medical decision making (see chart for details).    Final diagnoses:  Cervical paraspinous muscle spasm  Lumbar paraspinal muscle spasm  Radiculopathy affecting upper extremity    Patient was advised to: Begin Medrol dose pack Take muscle relaxer 3 times daily (Patient has been advised that if this makes them sleepy, they can just take this at bedtime, up to 20 mg per dose, and try breaking the tablets in half or 5 mg per dose during the day) Begin acetaminophen 1000 mg 3 times daily on a scheduled  basis. Apply ice pack to affected area 4 times daily for 20 minutes each time Consider physical therapy, chiropractic care, orthopedic follow-up Avoid stretching or strengthening exercises until pain is completely resolved Return precautions advised  Please see discharge instructions below for details of plan of care as provided to patient. ED Prescriptions     Medication Sig Dispense Auth. Provider   baclofen (LIORESAL) 10 MG tablet Take 1 tablet (10 mg total) by mouth 3 (three) times daily for 7 days. 21 tablet Theadora Rama Scales, PA-C   methylPREDNISolone (MEDROL DOSEPAK) 4 MG TBPK tablet Take 24 mg on day 1, 20 mg on day 2, 16 mg on day 3, 12 mg on day 4, 8 mg on day 5, 4 mg on day 6.  Take all tablets in each row at once, do not spread tablets out throughout the day. 21 tablet Theadora Rama Scales, PA-C   acetaminophen (TYLENOL) 500 MG tablet Take 2 tablets (1,000 mg total) by mouth every 8 (eight) hours. 180 tablet Theadora Rama Scales, PA-C      PDMP not reviewed this encounter.  Discharge Instructions:   Discharge Instructions      The mainstay of therapy for musculoskeletal pain is reduction of inflammation and relaxation of tension which is causing inflammation.  Keep in mind, pain always begets more pain.  To help you stay ahead of your pain and inflammation, I have provided the following regimen for you:   When you pick up your prescription from the pharmacy, please begin taking  Tylenol 975 mg 3 times daily (every 8 hours).  Please know that It is safe to take a maximum 3000 mg of Tylenol in a 24-hour period.  Please do not exceed this amount.  Tylenol works best when taken on a scheduled basis.  When you pick up your prescription from the pharmacy, you can begin taking baclofen 10 mg.  This is a highly effective muscle relaxer and antispasmodic which should continue to provide you with relaxation of your tense muscles, allow you to sleep well and to keep your pain  under control.  You can continue taking this medication 3 times daily as you need to.  If you find that this medication makes you too sleepy, you can break them in half for your daytime doses and, if needed double them for your nighttime dose.  Do not take more than 30 mg of baclofen in a 24-hour period.  Tomorrow morning, please begin taking methylprednisolone.  Please take 1 full row tablets at once with your breakfast meal.  This will continue to keep your inflammation under control until your body can heal. During the day, please set aside time to apply ice to the affected area 4 times daily for 20 minutes each application.  This can be achieved by using a bag of frozen peas or corn, a Ziploc bag filled with ice and water, or Ziploc bag filled with half rubbing alcohol and half Dawn dish detergent, frozen into a slush.  Please be careful not to apply ice directly to your skin, always place a soft cloth between you and the ice pack.  Over-the-counter products such as IcyHot and Biofreeze do not work nearly as well.   Please consider discussing referral to physical therapy with your primary care provider.  Physical therapist are very good at teasing out the underlying cause of acute musculoskeletal pain and helping with prevention of future recurrences.   Please avoid attempts to stretch or strengthen the affected area until you are feeling completely pain-free.  Attempts to do so will only prolong the healing process.   I also recommend that you remain out of work for the next several days, I provided you with a note to return to work in 3 days.  If you feel that you need this time extended, please follow-up with your primary care provider or return to urgent care for reevaluation so that we can provide you with a note for another 3 days.   Thank you for visiting [facility] today.  We appreciate the opportunity to participate in your care.     Disposition Upon Discharge:  Condition:  stable for discharge home Home: take medications as prescribed; routine discharge instructions as discussed; follow up as advised.  Patient presented with an acute illness with associated systemic symptoms and significant discomfort requiring urgent management. In my opinion, this is a condition that a prudent lay person (someone who possesses an average knowledge of health and medicine) may potentially expect to result in complications if not addressed urgently such as respiratory distress, impairment of bodily function or dysfunction of bodily organs.   Routine symptom specific, illness specific and/or disease specific instructions were discussed with the patient and/or caregiver at length.   As such, the patient has been evaluated and assessed, work-up was performed and treatment was provided in alignment with urgent care protocols and evidence based medicine.  Patient/parent/caregiver has been advised that the patient may require follow up for further testing and treatment if the symptoms  continue in spite of treatment, as clinically indicated and appropriate.  Patient/parent/caregiver has been advised to report to orthopedic urgent care clinic or return to the [facility] or PCP in 3-5 days if no better; follow-up with orthopedics, PCP or the Emergency Department if new signs and symptoms develop or if the current signs or symptoms continue to change or worsen for further workup, evaluation and treatment as clinically indicated and appropriate  The patient will follow up with their current PCP if and as advised. If the patient does not currently have a PCP we will have assisted them in obtaining one.   The patient may need specialty follow up if the symptoms continue, in spite of conservative treatment and management, for further workup, evaluation, consultation and treatment as clinically indicated and appropriate.  Patient/parent/caregiver verbalized understanding and agreement of plan as discussed.   All questions were addressed during visit.  Please see discharge instructions below for further details of plan.  This office note has been dictated using Teaching laboratory technician.  Unfortunately, this method of dictation can sometimes lead to typographical or grammatical errors.  I apologize for your inconvenience in advance if this occurs.  Please do not hesitate to reach out to me if clarification is needed.      Theadora Rama Scales, PA-C 06/21/23 1014

## 2023-06-21 NOTE — Discharge Instructions (Signed)
The mainstay of therapy for musculoskeletal pain is reduction of inflammation and relaxation of tension which is causing inflammation.  Keep in mind, pain always begets more pain.  To help you stay ahead of your pain and inflammation, I have provided the following regimen for you:   When you pick up your prescription from the pharmacy, please begin taking Tylenol 975 mg 3 times daily (every 8 hours).  Please know that It is safe to take a maximum 3000 mg of Tylenol in a 24-hour period.  Please do not exceed this amount.  Tylenol works best when taken on a scheduled basis.  When you pick up your prescription from the pharmacy, you can begin taking baclofen 10 mg.  This is a highly effective muscle relaxer and antispasmodic which should continue to provide you with relaxation of your tense muscles, allow you to sleep well and to keep your pain under control.  You can continue taking this medication 3 times daily as you need to.  If you find that this medication makes you too sleepy, you can break them in half for your daytime doses and, if needed double them for your nighttime dose.  Do not take more than 30 mg of baclofen in a 24-hour period.  Tomorrow morning, please begin taking methylprednisolone.  Please take 1 full row tablets at once with your breakfast meal.  This will continue to keep your inflammation under control until your body can heal. During the day, please set aside time to apply ice to the affected area 4 times daily for 20 minutes each application.  This can be achieved by using a bag of frozen peas or corn, a Ziploc bag filled with ice and water, or Ziploc bag filled with half rubbing alcohol and half Dawn dish detergent, frozen into a slush.  Please be careful not to apply ice directly to your skin, always place a soft cloth between you and the ice pack.  Over-the-counter products such as IcyHot and Biofreeze do not work nearly as well.   Please consider discussing referral to physical  therapy with your primary care provider.  Physical therapist are very good at teasing out the underlying cause of acute musculoskeletal pain and helping with prevention of future recurrences.   Please avoid attempts to stretch or strengthen the affected area until you are feeling completely pain-free.  Attempts to do so will only prolong the healing process.   I also recommend that you remain out of work for the next several days, I provided you with a note to return to work in 3 days.  If you feel that you need this time extended, please follow-up with your primary care provider or return to urgent care for reevaluation so that we can provide you with a note for another 3 days.   Thank you for visiting [facility] today.  We appreciate the opportunity to participate in your care.

## 2023-06-21 NOTE — ED Triage Notes (Signed)
Pt c/o lower back pain that radiates into buttocks x3 days.   Pt also c/o HA to back of neck, top of head, and behind eyes x2 days. States the last two mornings she has woken up with her eyes swollen. Denies any sinus congestion.   Pt also states she has been experiencing tingling ("like it's fallen asleep") in BLE intermittently since yesterday.

## 2023-08-05 ENCOUNTER — Ambulatory Visit: Payer: BLUE CROSS/BLUE SHIELD

## 2023-10-06 ENCOUNTER — Encounter: Payer: Self-pay | Admitting: Adult Health

## 2023-10-06 ENCOUNTER — Ambulatory Visit: Payer: BLUE CROSS/BLUE SHIELD | Admitting: Adult Health

## 2023-10-06 DIAGNOSIS — F411 Generalized anxiety disorder: Secondary | ICD-10-CM

## 2023-10-06 DIAGNOSIS — F909 Attention-deficit hyperactivity disorder, unspecified type: Secondary | ICD-10-CM | POA: Diagnosis not present

## 2023-10-06 DIAGNOSIS — F331 Major depressive disorder, recurrent, moderate: Secondary | ICD-10-CM

## 2023-10-06 MED ORDER — FLUOXETINE HCL 20 MG PO CAPS
20.0000 mg | ORAL_CAPSULE | Freq: Every day | ORAL | 2 refills | Status: DC
Start: 2023-10-06 — End: 2023-10-12

## 2023-10-06 MED ORDER — AMPHETAMINE-DEXTROAMPHET ER 20 MG PO CP24: 20.0000 mg | ORAL_CAPSULE | Freq: Two times a day (BID) | ORAL | 0 refills | Status: DC

## 2023-10-06 MED ORDER — AMPHETAMINE-DEXTROAMPHET ER 20 MG PO CP24
20.0000 mg | ORAL_CAPSULE | Freq: Two times a day (BID) | ORAL | 0 refills | Status: DC
Start: 2023-10-06 — End: 2023-10-12

## 2023-10-06 NOTE — Progress Notes (Signed)
 Kim Stanton [id number] [birth year] 32 y.o.  Subjective:   Patient ID:  Kim Stanton is a 33 y.o. (DOB May 09, 1991) female.  Chief Complaint: No chief complaint on file.   HPI Kim Stanton presents to the office today for follow-up of  MDD, GAD and ADHD.  Describes mood today as ok. Pleasant. Tearful at times. Mood symptoms - reports moments of depression. Reports anxiety at times. Reports increased irritability - gets overwhelmed and over stimulated. Denies recent panic attacks. Reports  worry, rumination, and over thinking. Mood is consistent - able to walk through things. Stating I feel like I could be doing better, but I have hit a wall. Has been off medications for the past several months and feels like she needs to restart them.  Varying interest and motivation. Taking medications as prescribed.  Energy levels lower. Active, has a regular exercise routine. Walking all day. Enjoys some usual interests and activities. Single. Lives with daughter, age 110. Mother local. Spending time with family. Appetite adequate. Weight gain - 140 to 165 pounds. Sleep is variable. Averages 7 hours. Focus and concentration difficulties. Completing tasks. Managing aspects of household. 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   Denies SI or HI.  Denies AH or VH. Denies self harm. Denies substance use.  Previous medication trial: Adderall XR    Flowsheet Row ED from 06/21/2023 in [facility] at [facility]) ED from 02/25/2023 in [facility] at [facility] ED from 02/12/2023 in [facility] at [facility])  C-SSRS RISK CATEGORY No Risk No Risk No Risk        Review of Systems:  Review of Systems  Musculoskeletal:  Negative for gait problem.  Neurological:  Negative for tremors.  Psychiatric/Behavioral:         Please refer to HPI    Medications: I have reviewed the patient's current  medications.  Current Outpatient Medications  Medication Sig Dispense Refill   amphetamine -dextroamphetamine (ADDERALL XR) 20 MG 24 hr capsule Take 1 capsule (20 mg total) by mouth 2 (two) times daily. 60 capsule 0   amphetamine -dextroamphetamine (ADDERALL XR) 20 MG 24 hr capsule Take 1 capsule (20 mg total) by mouth 2 (two) times daily. 60 capsule 0   amphetamine -dextroamphetamine (ADDERALL XR) 20 MG 24 hr capsule Take 1 capsule (20 mg total) by mouth 2 (two) times daily. 60 capsule 0   etonogestrel  (NEXPLANON ) 68 MG IMPL implant 1 each by Subdermal route once.     methocarbamol  (ROBAXIN ) 500 MG tablet Take 1 tablet (500 mg total) by mouth daily as needed for muscle spasms. 20 tablet 0   methylPREDNISolone  (MEDROL  DOSEPAK) 4 MG TBPK tablet Take 24 mg on day 1, 20 mg on day 2, 16 mg on day 3, 12 mg on day 4, 8 mg on day 5, 4 mg on day 6.  Take all tablets in each row at once, do not spread tablets out throughout the day. 21 tablet 0   sertraline  (ZOLOFT ) 50 MG tablet Take 1 tablet (50 mg total) by mouth daily. 90 tablet 1   valACYclovir  (VALTREX ) 1000 MG tablet Take 1 tablet (1,000 mg total) by mouth 2 (two) times daily. Take for 5 days 30 tablet PRN   No current facility-administered medications for this visit.    Medication Side Effects: None  Allergies:  Allergies  Allergen Reactions   Lactose Intolerance (Gi) Nausea Only and Other (See Comments)  Upsets the stomach    Past Medical History:  Diagnosis Date   Encounter for supervision of normal pregnancy, antepartum 12/21/2018    Nursing Staff Provider Office Location  [facility] Dating   LMP 10/10/2018 Language   English Anatomy US    11/25/2018 Flu Vaccine   Declined 12/21/2018 Genetic Screen  NIPS:   AFP:   First Screen:  Quad:   TDaP vaccine    Hgb A1C or  GTT Early  Third trimester  Rhogam     LAB RESULTS  Feeding Plan  Breast Blood Type --/--/AB POS Performed at [facility], 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   Please see review of systems for further details on the patient's review from today.   Objective:   Physical Exam:  There were no vitals taken for this visit.  Physical Exam Constitutional:      General: She is not in acute distress. Musculoskeletal:        General: No deformity.  Neurological:     Mental Status: She is alert and oriented to person, place, and time. Mental status is at baseline.     Coordination: Coordination normal.  Psychiatric:        Attention and Perception: Attention and perception normal. She does not perceive auditory or visual hallucinations.        Mood and Affect: Affect is not labile, blunt, angry or inappropriate.        Speech: Speech normal.        Behavior: Behavior normal.        Thought Content: Thought content normal. Thought content is not paranoid or delusional. Thought content does not include homicidal or suicidal ideation. Thought content does not include homicidal or suicidal plan.        Cognition and Memory: Cognition and memory normal.        Judgment: Judgment normal.     Comments: Insight intact     Lab Review:     Component Value Date/Time   NA 134 (L) 07/16/2019 1950   NA 137 06/15/2019 1047   K 4.0 07/16/2019 1950   CL 104 07/16/2019 1950   CO2 19 (L) 07/16/2019 1950   GLUCOSE 91 07/16/2019 1950   BUN 14 07/16/2019 1950   BUN 5 (L) 06/15/2019 1047   CREATININE 0.69 07/16/2019 1950   CALCIUM 9.4 07/16/2019 1950   PROT 6.5 07/16/2019 1950   PROT 6.3 06/15/2019 1047   ALBUMIN 3.4 (L) 07/16/2019 1950   ALBUMIN 3.9 06/15/2019 1047   AST 25 07/16/2019 1950   ALT 13 07/16/2019 1950   ALKPHOS 196 (H) 07/16/2019 1950   BILITOT 0.4 07/16/2019 1950   BILITOT <0.2 06/15/2019 1047   GFRNONAA >60 07/16/2019  1950   GFRAA >60 07/16/2019 1950       Component Value Date/Time   WBC 3.8 02/12/2023 1223   WBC 7.0 07/16/2019 1923   RBC 4.70 02/12/2023 1223   RBC 4.27 07/16/2019 1923   HGB 15.3 02/12/2023 1223   HCT 45.3 02/12/2023 1223   PLT 346 02/12/2023 1223   MCV 96 02/12/2023 1223   MCH 32.6 02/12/2023 1223   MCH 30.0 07/16/2019 1923   MCHC 33.8 02/12/2023 1223   MCHC 33.3 07/16/2019 1923   RDW 12.3  02/12/2023 1223   LYMPHSABS 2.1 02/12/2023 1223   MONOABS 0.5 05/24/2018 1249   EOSABS 0.1 02/12/2023 1223   BASOSABS 0.0 02/12/2023 1223    No results found for: POCLITH, LITHIUM   No results found for: PHENYTOIN, PHENOBARB, VALPROATE, CBMZ   .res Assessment: Plan:    Plan:  PDMP reviewed  1. Restart Adderall XR 20mg  daily to BID 2. D/C Zoloft  50mg  daily  3. Add Prozac  20mg  daily  Consider lexapro  Monitor BP between visits.  Read and reviewed note with patient for accuracy.   RTC 4 weeks  Patient advised to contact office with any questions, adverse effects, or acute worsening in signs and symptoms.  Discussed potential benefits, risks, and side effects of stimulants with patient to include increased heart rate, palpitations, insomnia, increased anxiety, increased irritability, or decreased appetite.  Instructed patient to contact office if experiencing any significant tolerability issues.  There are no diagnoses linked to this encounter.   Please see After Visit Summary for patient specific instructions.  Future Appointments  Date Time Provider Department Center  10/06/2023  3:30 PM Oluwadarasimi Favor Nattalie, NP [facility] None    No orders of the defined types were placed in this encounter.   -------------------------------

## 2023-10-09 ENCOUNTER — Ambulatory Visit: Payer: BLUE CROSS/BLUE SHIELD

## 2023-10-10 ENCOUNTER — Telehealth: Payer: Self-pay | Admitting: Adult Health

## 2023-10-10 NOTE — Telephone Encounter (Signed)
 Pt called and said that she needs a PA on her adderall 20 mg xr. It is a quantity issue with her insurance. She takes two a day and insurance will pay for one

## 2023-10-11 ENCOUNTER — Telehealth: Payer: Self-pay

## 2023-10-11 NOTE — Telephone Encounter (Signed)
 Prior Authorization initiated for Amphetamine-dextroamphetamine XR 20 mg #60/30 day approved with [employer] effective 10/11/2023-10/10/2024

## 2023-10-11 NOTE — Telephone Encounter (Signed)
 Prior Approval received effective 10/11/2023-10/10/2024

## 2023-10-11 NOTE — Telephone Encounter (Signed)
 Noted I will initiate a PA nothing received yet.

## 2023-10-12 ENCOUNTER — Other Ambulatory Visit: Payer: Self-pay

## 2023-10-12 ENCOUNTER — Ambulatory Visit
Admission: RE | Admit: 2023-10-12 | Discharge: 2023-10-12 | Disposition: A | Discharge status: Home / Self Care | Payer: BLUE CROSS/BLUE SHIELD | Source: Ambulatory Visit | Attending: Internal Medicine | Admitting: Internal Medicine

## 2023-10-12 VITALS — BP 123/87 | HR 89 | Temp 98.8°F | Resp 16

## 2023-10-12 DIAGNOSIS — J209 Acute bronchitis, unspecified: Secondary | ICD-10-CM

## 2023-10-12 MED ORDER — PROMETHAZINE-DM 6.25-15 MG/5ML PO SYRP: 5.0000 mL | ORAL_SOLUTION | Freq: Four times a day (QID) | ORAL | 0 refills | Status: DC | PRN

## 2023-10-12 MED ORDER — AMOXICILLIN-POT CLAVULANATE 875-125 MG PO TABS: 1.0000 | ORAL_TABLET | Freq: Two times a day (BID) | ORAL | 0 refills | Status: AC

## 2023-10-12 NOTE — Discharge Instructions (Addendum)
 A prescription was sent for Augmentin . This is an antibiotic used to treat upper respiratory infections. Take as directed. I have also sent you a cough medicine. Take this at bedtime when you are not driving or working because it can make you sleepy.   Return in 3-4 days if no improvement. It is very important for you to pay attention to any new symptoms or worsening of your current condition. Please go directly to the Emergency Department immediately should you begin to have any of the following symptoms: shortness of breath, chest pain or difficulty breathing.

## 2023-10-12 NOTE — ED Provider Notes (Signed)
 [facility]  Note:  This document was prepared using Dragon voice recognition software and may include unintentional dictation errors.  MRN: [id number] DOB: 06/24/1991 DATE: 10/12/23   Subjective:  Chief Complaint:  Chief Complaint  Patient presents with   Cough    HPI: Kim Stanton is a 33 y.o. female presenting for productive cough for one week. Patient states she started with a sore throat a week ago. She states she soon developed congestion and cough. She became concerned when the cough lingered and started to become more productive. She reports increased chest congestion. She has been taking OTC tylenol  cough and congestion with no relief. Denies fever, nausea/vomiting, otalgia. Endorses cough, congestion, sore throat. Presents NAD.  Prior to Admission medications   Medication Sig Start Date End Date Taking? Authorizing Provider  amoxicillin -clavulanate (AUGMENTIN ) 875-125 MG tablet Take 1 tablet by mouth every 12 (twelve) hours for 7 days. 10/12/23 10/19/23 Yes Emmaleigh Longo P, PA-C  etonogestrel  (NEXPLANON ) 68 MG IMPL implant 1 each by Subdermal route once.   Yes [provider]  promethazine -dextromethorphan (PROMETHAZINE -DM) 6.25-15 MG/5ML syrup Take 5 mLs by mouth 4 (four) times daily as needed for cough. 10/12/23  Yes Sharyon Peitz P, PA-C     Allergies  Allergen Reactions   Lactose Intolerance (Gi) Nausea Only and Other (See Comments)    Upsets the stomach    History:   Past Medical History:  Diagnosis Date   Encounter for supervision of normal pregnancy, antepartum 12/21/2018    Nursing Staff Provider Office Location  [facility] Dating   LMP 10/10/2018 Language   English Anatomy US    11/25/2018 Flu Vaccine   Declined 12/21/2018 Genetic Screen  NIPS:   AFP:   First Screen:  Quad:   TDaP vaccine    Hgb A1C or  GTT Early  Third trimester  Rhogam     LAB RESULTS  Feeding Plan  Breast Blood Type --/--/AB POS Performed at [facility], 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     Past Surgical History:  Procedure Laterality Date   WISDOM TOOTH EXTRACTION      Family History  Problem Relation Age of Onset   Healthy Mother    Healthy Father    Cancer Maternal Grandmother        Breast   Diabetes Maternal Grandmother     Social History   Tobacco Use   Smoking status: Some Days    Types: Cigars   Smokeless tobacco: Never  Vaping Use   Vaping status: Former  Substance Use Topics   Alcohol use: Not Currently    Alcohol/week: 5.0 standard drinks of alcohol    Types: 5 Standard drinks or equivalent per week    Comment: weekly   Drug use: Never    Review of Systems  Constitutional:  Negative for fever.  HENT:  Positive for congestion, rhinorrhea and sore throat. Negative for ear pain.   Respiratory:  Positive for cough.   Gastrointestinal:  Negative for abdominal pain, nausea and vomiting.  Neurological:  Positive for headaches.     Objective:   Vitals: BP 123/87   Pulse 89   Temp 98.8 F (37.1 C) (Oral)   Resp 16   SpO2 97%   Physical Exam Constitutional:      General: She is not in acute distress.    Appearance: Normal appearance. She is well-developed and normal weight.  She is not ill-appearing or toxic-appearing.  HENT:     Head: Normocephalic and atraumatic.     Right Ear: Tympanic membrane and ear canal normal.     Left Ear: Tympanic membrane and ear canal normal.     Mouth/Throat:     Pharynx: Uvula midline. Pharyngeal swelling present. No oropharyngeal exudate or posterior oropharyngeal erythema.     Tonsils: No tonsillar exudate or tonsillar abscesses.  Cardiovascular:     Rate and Rhythm: Normal rate and regular rhythm.     Heart sounds: Normal heart sounds.  Pulmonary:     Effort: Pulmonary effort is normal.     Breath sounds: Normal breath sounds.     Comments: Clear to auscultation bilaterally  Abdominal:      General: Bowel sounds are normal.     Palpations: Abdomen is soft.     Tenderness: There is no abdominal tenderness.  Skin:    General: Skin is warm and dry.  Neurological:     General: No focal deficit present.     Mental Status: She is alert.  Psychiatric:        Mood and Affect: Mood and affect normal.     Results:  Labs: No results found for this or any previous visit (from the past 24 hours).  Radiology: No results found.   UC Course/Treatments:  Procedures: Procedures   Medications Ordered in UC: Medications - No data to display   Assessment and Plan :     ICD-10-CM   1. Acute bronchitis, unspecified organism  J20.9      Acute bronchitis, unspecified organism Afebrile, nontoxic-appearing, NAD. VSS. DDX includes but not limited to: COVID, flu, bronchitis, pneumonia, viral URI COVID and flu not ordered given length of symptoms. Given worsening cough, Augmentin  875mg  BID was prescribed. Promethazine -DM QID PRN was prescribed for cough. Strict ED precautions were given and patient verbalized understanding.  ED Discharge Orders          Ordered    amoxicillin -clavulanate (AUGMENTIN ) 875-125 MG tablet  Every 12 hours        10/12/23 1554    promethazine -dextromethorphan (PROMETHAZINE -DM) 6.25-15 MG/5ML syrup  4 times daily PRN        10/12/23 1554             PDMP not reviewed this encounter.     Avi Body, PA-C 10/12/23 1616

## 2023-10-12 NOTE — ED Triage Notes (Addendum)
 C/O thick, foul-tasting productive cough onset approx 1 wk ago; c/o chest discomfort. States initially started with sore throat. Denies fevers. Has been taking Tyl Congestion pill.

## 2023-10-31 ENCOUNTER — Ambulatory Visit: Payer: BLUE CROSS/BLUE SHIELD

## 2023-11-01 ENCOUNTER — Telehealth (INDEPENDENT_AMBULATORY_CARE_PROVIDER_SITE_OTHER): Payer: BLUE CROSS/BLUE SHIELD | Admitting: Adult Health

## 2023-11-01 DIAGNOSIS — Z0389 Encounter for observation for other suspected diseases and conditions ruled out: Secondary | ICD-10-CM

## 2023-11-01 NOTE — Progress Notes (Signed)
Patient no show appointment. Called patient - no VM set up.

## 2024-01-07 ENCOUNTER — Ambulatory Visit

## 2024-01-08 ENCOUNTER — Ambulatory Visit

## 2024-01-08 ENCOUNTER — Ambulatory Visit
Admission: RE | Admit: 2024-01-08 | Discharge: 2024-01-08 | Disposition: A | Discharge status: Home / Self Care | Source: Ambulatory Visit

## 2024-01-08 VITALS — BP 119/83 | HR 90 | Temp 99.1°F | Resp 18

## 2024-01-08 DIAGNOSIS — Z91038 Other insect allergy status: Secondary | ICD-10-CM | POA: Diagnosis not present

## 2024-01-08 HISTORY — DX: Herpesviral infection, unspecified: B00.9

## 2024-01-08 NOTE — ED Provider Notes (Signed)
 [facility]    CSN: [id number] Arrival date & time: 01/08/24  1138    HISTORY   Chief Complaint  Patient presents with   Insect Bite    Entered by patient   HPI [name] is a pleasant, 33 y.o. female who presents to urgent care today. Pt states she believes she has a spider bite.  States that 5 days ago, she noticed a small area of redness on her right upper chest that began as a red bump and has gotten bigger.  Patient states it is been itching, denies scratching it but states that the bump opened and had discharge from it.  Patient states she has been applying alcohol, Neosporin and an over-the-counter itch cream without relief.  Patient denies fever, chills, headache, shortness of breath, lip swelling, tongue swelling.  The history is provided by the patient.   Past Medical History:  Diagnosis Date   Encounter for supervision of normal pregnancy, antepartum 12/21/2018    Nursing Staff Provider Office Location  [facility] Dating   LMP 10/10/2018 Language   English Anatomy US    11/25/2018 Flu Vaccine   Declined 12/21/2018 Genetic Screen  NIPS:   AFP:   First Screen:  Quad:   TDaP vaccine    Hgb A1C or  GTT Early  Third trimester  Rhogam     LAB RESULTS  Feeding Plan  Breast Blood Type --/--/AB POS Performed at [facility], 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 Authorizing Provider   amphetamine-dextroamphetamine (ADDERALL XR) 20 MG 24 hr capsule Take 20 mg by mouth 2 (two) times daily. 12/12/23  Yes [provider]  FLUoxetine (PROZAC) 20 MG capsule Take 20 mg by mouth daily. 12/10/23  Yes [provider]  valACYclovir (VALTREX) 1000 MG tablet Take 1,000 mg by mouth 2 (two) times daily.   Yes [provider]  etonogestrel (NEXPLANON) 68 MG IMPL implant 1 each by Subdermal route once.    [provider]    Family History Family History  Problem Relation Age of Onset   Healthy Mother    Healthy Father    Cancer Maternal Grandmother        Breast   Diabetes Maternal Grandmother    Social History Social History   Tobacco Use   Smoking status: Some Days    Types: Cigars    Passive exposure: Current   Smokeless tobacco: Never  Vaping Use   Vaping status: Former  Substance Use Topics   Alcohol use: Yes    Alcohol/week: 5.0 standard drinks of alcohol    Types: 5  Standard drinks or equivalent per week    Comment: weekly   Drug use: Never   Allergies   Lactose intolerance (gi)  Review of Systems Review of Systems Pertinent findings revealed after performing a 14 point review of systems has been noted in the history of present illness.  Physical Exam Vital Signs BP 119/83 (BP Location: Right Arm)   Pulse 90   Temp 99.1 F (37.3 C) (Oral)   Resp 18   LMP 11/24/2023 (Approximate)   SpO2 97%   No data found.  Physical Exam Vitals and nursing note reviewed.  Constitutional:      General: She is not in acute distress.    Appearance: Normal appearance.  HENT:     Head: Normocephalic and atraumatic.  Eyes:     Pupils: Pupils are equal, round, and reactive to light.  Cardiovascular:     Rate and Rhythm: Normal rate and regular rhythm.  Pulmonary:     Effort: Pulmonary effort is normal.     Breath sounds: Normal breath sounds.  Musculoskeletal:        General: Normal range of motion.     Cervical back: Normal  range of motion and neck supple.  Skin:    General: Skin is warm and dry.     Findings: Lesion (There is a well-demarcated, 1 cm area of erythema with small central punctate located on right upper chest without induration, drainage, surrounding erythema) present.  Neurological:     General: No focal deficit present.     Mental Status: She is alert and oriented to person, place, and time. Mental status is at baseline.  Psychiatric:        Mood and Affect: Mood normal.        Behavior: Behavior normal.        Thought Content: Thought content normal.        Judgment: Judgment normal.     Visual Acuity Right Eye Distance:   Left Eye Distance:   Bilateral Distance:    Right Eye Near:   Left Eye Near:    Bilateral Near:     UC Couse / Diagnostics / Procedures:     Radiology No results found.  Procedures Procedures (including critical care time) EKG  Pending results:  Labs Reviewed - No data to display  Medications Ordered in UC: Medications - No data to display  UC Diagnoses / Final Clinical Impressions(s)   I have reviewed the triage vital signs and the nursing notes.  Pertinent labs & imaging results that were available during my care of the patient were reviewed by me and considered in my medical decision making (see chart for details).    Final diagnoses:  Allergic reaction to insect bite   Patient advised no concern for infection or venomous spider bite at this time.  Recommend topical Benadryl and oral antihistamine.  Return precautions advised.  Please see discharge instructions below for details of plan of care as provided to patient. ED Prescriptions   None    PDMP not reviewed this encounter.  Pending results:  Labs Reviewed - No data to display    Discharge Instructions      The lesion on your right upper chest is most concerning for an insect bite to which you are allergic.  For relief of your itching, please apply Benadryl topical cream to the  affected area as directed by the manufacturer.  Thank you for visiting [facility] today.    Disposition Upon  Discharge:  Condition: stable for discharge home  Patient presented with an acute illness with associated systemic symptoms and significant discomfort requiring urgent management. In my opinion, this is a condition that a prudent lay person (someone who possesses an average knowledge of health and medicine) may potentially expect to result in complications if not addressed urgently such as respiratory distress, impairment of bodily function or dysfunction of bodily organs.   Routine symptom specific, illness specific and/or disease specific instructions were discussed with the patient and/or caregiver at length.   As such, the patient has been evaluated and assessed, work-up was performed and treatment was provided in alignment with urgent care protocols and evidence based medicine.  Patient/parent/caregiver has been advised that the patient may require follow up for further testing and treatment if the symptoms continue in spite of treatment, as clinically indicated and appropriate.  Patient/parent/caregiver has been advised to return to the [facility] or PCP if no better; to PCP or the Emergency Department if new signs and symptoms develop, or if the current signs or symptoms continue to change or worsen for further workup, evaluation and treatment as clinically indicated and appropriate  The patient will follow up with their current PCP if and as advised. If the patient does not currently have a PCP we will assist them in obtaining one.   The patient may need specialty follow up if the symptoms continue, in spite of conservative treatment and management, for further workup, evaluation, consultation and treatment as clinically indicated and appropriate.  Patient/parent/caregiver verbalized understanding and agreement of plan as discussed.  All questions were addressed during  visit.  Please see discharge instructions below for further details of plan.  This office note has been dictated using Teaching laboratory technician.  Unfortunately, this method of dictation can sometimes lead to typographical or grammatical errors.  I apologize for your inconvenience in advance if this occurs.  Please do not hesitate to reach out to me if clarification is needed.      Eloise Hake Scales, PA-C 01/08/24 1251

## 2024-01-08 NOTE — ED Triage Notes (Signed)
 Pt states she believers she has a spider bite. The area has been started as a red bump and then the redness spread. The areas has been itching and then it opened and had discharge.   Start date: 4/8  Home Interventions: Alcohol, neosporin, and itch cream

## 2024-01-08 NOTE — Discharge Instructions (Signed)
 The lesion on your right upper chest is most concerning for an insect bite to which you are allergic.  For relief of your itching, please apply Benadryl topical cream to the affected area as directed by the manufacturer.  Thank you for visiting [facility] today.

## 2024-03-01 ENCOUNTER — Telehealth: Payer: Self-pay | Admitting: Adult Health

## 2024-03-01 NOTE — Telephone Encounter (Signed)
 Pt made an appt for 03/05/24. She needs a refill on her adderall xr 20 mg. Pharmacy is [facility] [address] in [place]

## 2024-03-02 ENCOUNTER — Other Ambulatory Visit: Payer: Self-pay

## 2024-03-02 MED ORDER — AMPHETAMINE-DEXTROAMPHET ER 20 MG PO CP24: 20.0000 mg | ORAL_CAPSULE | Freq: Two times a day (BID) | ORAL | 0 refills | Status: DC

## 2024-03-02 NOTE — Telephone Encounter (Signed)
 Pended enough Adderall to appt to [facility]

## 2024-03-02 NOTE — Telephone Encounter (Signed)
 She was first seen in 2022 and has had 7 no shows since then.  NS 2/4, 03/21/23, 03/15/23. She was seen 1/9. Kim Stanton said she usually doesn't dismiss until misses 3 in a row.

## 2024-03-05 ENCOUNTER — Encounter: Payer: Self-pay | Admitting: Adult Health

## 2024-03-05 ENCOUNTER — Telehealth: Admitting: Adult Health

## 2024-03-05 DIAGNOSIS — F909 Attention-deficit hyperactivity disorder, unspecified type: Secondary | ICD-10-CM | POA: Diagnosis not present

## 2024-03-05 DIAGNOSIS — F411 Generalized anxiety disorder: Secondary | ICD-10-CM | POA: Diagnosis not present

## 2024-03-05 DIAGNOSIS — F331 Major depressive disorder, recurrent, moderate: Secondary | ICD-10-CM

## 2024-03-05 MED ORDER — AMPHETAMINE-DEXTROAMPHET ER 20 MG PO CP24
20.0000 mg | ORAL_CAPSULE | Freq: Two times a day (BID) | ORAL | 0 refills | Status: DC
Start: 2024-04-30 — End: 2024-06-05

## 2024-03-05 MED ORDER — FLUOXETINE HCL 20 MG PO CAPS
20.0000 mg | ORAL_CAPSULE | Freq: Every day | ORAL | 2 refills | Status: DC
Start: 2024-03-05 — End: 2024-06-05

## 2024-03-05 MED ORDER — AMPHETAMINE-DEXTROAMPHET ER 20 MG PO CP24
20.0000 mg | ORAL_CAPSULE | Freq: Two times a day (BID) | ORAL | 0 refills | Status: DC
Start: 2024-04-02 — End: 2024-06-05

## 2024-03-05 MED ORDER — AMPHETAMINE-DEXTROAMPHET ER 20 MG PO CP24
20.0000 mg | ORAL_CAPSULE | Freq: Two times a day (BID) | ORAL | 0 refills | Status: DC
Start: 2024-03-05 — End: 2024-06-05

## 2024-03-05 NOTE — Progress Notes (Signed)
 Kim Stanton [id number] June 18, 1991 33 y.o.  Virtual Visit via Video Note  I connected with pt @ on 03/05/24 at 10:00 AM EDT by a video enabled telemedicine application and verified that I am speaking with the correct person using two identifiers.   I discussed the limitations of evaluation and management by telemedicine and the availability of in person appointments. The patient expressed understanding and agreed to proceed.  I discussed the assessment and treatment plan with the patient. The patient was provided an opportunity to ask questions and all were answered. The patient agreed with the plan and demonstrated an understanding of the instructions.   The patient was advised to call back or seek an in-person evaluation if the symptoms worsen or if the condition fails to improve as anticipated.  I provided 20 minutes of non-face-to-face time during this encounter.  The patient was located at home.  The provider was located at [facility].   Reagan Camera, NP   Subjective:   Patient ID:  Kim Stanton is a 33 y.o. (DOB 01/26/1991) female.  Chief Complaint: No chief complaint on file.   HPI Kim Stanton presents for follow-up of  MDD, GAD and ADHD.  Describes mood today as "ok". Pleasant. Tearful at times. Mood symptoms - reports decreased depression - "it comes and goes". Reports stable interest and motivation. Denies anxiety and irritability. Denies recent panic attacks. Reports  worry, rumination and over thinking. Reports mood is stable. Stating "I feel like I'm doing ok". Feels like the medications work well. Taking medications as prescribed.  Energy levels improved. Active, has a regular exercise routine. Walking all day. Enjoys some usual interests and activities. Single. Lives with daughter, age 22 - shared custody 50/50. Mother local. Spending time with family. Appetite adequate. Weight gain - 140 to 165 pounds. Sleep is variable. Averages 7 hours. Focus  and concentration difficulties. Completing tasks. Managing aspects of household. 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   Denies SI or HI.  Denies AH or VH. Denies self harm. Denies substance use.  Previous medication trial: Adderall XR    Review of Systems:  Review of Systems  Musculoskeletal:  Negative for gait problem.  Neurological:  Negative for tremors.  Psychiatric/Behavioral:         Please refer to HPI    Medications: I have reviewed the patient's current medications.  Current Outpatient Medications  Medication Sig Dispense Refill   amphetamine -dextroamphetamine (ADDERALL XR) 20 MG 24 hr capsule Take 1 capsule (20 mg total) by mouth 2 (two) times daily. 8 capsule 0   etonogestrel  (NEXPLANON ) 68 MG IMPL implant 1 each by Subdermal route once.     FLUoxetine  (PROZAC ) 20 MG capsule Take 20 mg by mouth daily.     valACYclovir  (VALTREX ) 1000 MG tablet Take 1,000 mg by mouth 2 (two) times daily.     No current facility-administered medications for this visit.    Medication Side Effects: None  Allergies:  Allergies  Allergen Reactions   Lactose Intolerance (Gi) Nausea Only and Other (See Comments)    Upsets the stomach    Past Medical History:  Diagnosis Date   Encounter for supervision of normal pregnancy, antepartum 12/21/2018    Nursing Staff Provider Office Location  [facility] Dating   LMP 10/10/2018 Language   English Anatomy US    11/25/2018 Flu Vaccine   Declined 12/21/2018 Genetic Screen  NIPS:   AFP:   First Screen:  Quad:   TDaP vaccine  Hgb A1C or  GTT Early  Third trimester  Rhogam     LAB RESULTS  Feeding Plan  Breast Blood Type --/--/AB POS Performed at [facility], 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     Family History  Problem Relation Age of Onset   Healthy Mother    Healthy Father    Cancer Maternal Grandmother        Breast   Diabetes  Maternal Grandmother     Social History   Socioeconomic History   Marital status: Single    Spouse name: Not on file   Number of children: Not on file   Years of education: Not on file   Highest education level: Not on file  Occupational History   Not on file  Tobacco Use   Smoking status: Some Days    Types: Cigars    Passive exposure: Current   Smokeless tobacco: Never  Vaping Use   Vaping status: Former  Substance and Sexual Activity   Alcohol use: Yes    Alcohol/week: 5.0 standard drinks of alcohol    Types: 5 Standard drinks or equivalent per week    Comment: weekly   Drug use: Never   Sexual activity: Yes    Partners: Male    Birth control/protection: Implant  Other Topics Concern   Not on file  Social History Narrative   Not on file   Social Drivers of Health   Financial Resource Strain: Not on file  Food Insecurity: Not on file  Transportation Needs: Not on file  Physical Activity: Not on file  Stress: Not on file  Social Connections: Not on file  Intimate Partner Violence: Not on file    Past Medical History, Surgical history, Social history, and Family history were reviewed and updated as appropriate.   Please see review of systems for further details on the patient's review from today.   Objective:   Physical Exam:  There were no vitals taken for this visit.  Physical Exam Constitutional:      General: She is not in acute distress. Musculoskeletal:        General: No deformity.  Neurological:     Mental Status: She is alert and oriented to person, place, and time.     Coordination: Coordination normal.  Psychiatric:        Attention and Perception: Attention and perception normal. She does not perceive auditory or visual hallucinations.        Mood and Affect: Mood normal. Mood is not anxious or depressed. Affect is not labile, blunt, angry or inappropriate.        Speech: Speech normal.        Behavior: Behavior normal.        Thought  Content: Thought content normal. Thought content is not paranoid or delusional. Thought content does not include homicidal or suicidal ideation. Thought content does not include homicidal or suicidal plan.        Cognition and Memory: Cognition and memory normal.        Judgment: Judgment normal.     Comments: Insight intact     Lab Review:     Component Value Date/Time   NA 134 (L) 07/16/2019 1950   NA 137 06/15/2019 1047   K 4.0 07/16/2019 1950   CL 104 07/16/2019 1950   CO2 19 (L) 07/16/2019 1950   GLUCOSE  91 07/16/2019 1950   BUN 14 07/16/2019 1950   BUN 5 (L) 06/15/2019 1047   CREATININE 0.69 07/16/2019 1950   CALCIUM 9.4 07/16/2019 1950   PROT 6.5 07/16/2019 1950   PROT 6.3 06/15/2019 1047   ALBUMIN 3.4 (L) 07/16/2019 1950   ALBUMIN 3.9 06/15/2019 1047   AST 25 07/16/2019 1950   ALT 13 07/16/2019 1950   ALKPHOS 196 (H) 07/16/2019 1950   BILITOT 0.4 07/16/2019 1950   BILITOT <0.2 06/15/2019 1047   GFRNONAA >60 07/16/2019 1950   GFRAA >60 07/16/2019 1950       Component Value Date/Time   WBC 3.8 02/12/2023 1223   WBC 7.0 07/16/2019 1923   RBC 4.70 02/12/2023 1223   RBC 4.27 07/16/2019 1923   HGB 15.3 02/12/2023 1223   HCT 45.3 02/12/2023 1223   PLT 346 02/12/2023 1223   MCV 96 02/12/2023 1223   MCH 32.6 02/12/2023 1223   MCH 30.0 07/16/2019 1923   MCHC 33.8 02/12/2023 1223   MCHC 33.3 07/16/2019 1923   RDW 12.3 02/12/2023 1223   LYMPHSABS 2.1 02/12/2023 1223   MONOABS 0.5 05/24/2018 1249   EOSABS 0.1 02/12/2023 1223   BASOSABS 0.0 02/12/2023 1223    No results found for: "POCLITH", "LITHIUM"   No results found for: "PHENYTOIN", "PHENOBARB", "VALPROATE", "CBMZ"   .res Assessment: Plan:    Plan:  PDMP reviewed  Adderall XR 20mg  BID Prozac  20mg  daily  Monitor BP between visits.  Read and reviewed note with patient for accuracy.   RTC 3 months  20 minutes spent dedicated to the care of this patient on the date of this encounter to include  pre-visit review of records, ordering of medication, post visit documentation, and face-to-face time with the patient discussing depression, anxiety and ADD. Discussed continuing current medication regimen.  Patient advised to contact office with any questions, adverse effects, or acute worsening in signs and symptoms.  Discussed potential benefits, risks, and side effects of stimulants with patient to include increased heart rate, palpitations, insomnia, increased anxiety, increased irritability, or decreased appetite.  Instructed patient to contact office if experiencing any significant tolerability issues.  There are no diagnoses linked to this encounter.   Please see After Visit Summary for patient specific instructions.  No future appointments.  No orders of the defined types were placed in this encounter.     -------------------------------

## 2024-05-31 ENCOUNTER — Other Ambulatory Visit: Payer: Self-pay

## 2024-05-31 DIAGNOSIS — B009 Herpesviral infection, unspecified: Secondary | ICD-10-CM

## 2024-05-31 MED ORDER — VALACYCLOVIR HCL 500 MG PO TABS
500.0000 mg | ORAL_TABLET | Freq: Two times a day (BID) | ORAL | 0 refills | Status: AC
Start: 2024-05-31 — End: ?

## 2024-06-05 ENCOUNTER — Telehealth (INDEPENDENT_AMBULATORY_CARE_PROVIDER_SITE_OTHER): Payer: Self-pay | Admitting: Adult Health

## 2024-06-05 ENCOUNTER — Encounter: Payer: Self-pay | Admitting: Adult Health

## 2024-06-05 DIAGNOSIS — F331 Major depressive disorder, recurrent, moderate: Secondary | ICD-10-CM

## 2024-06-05 DIAGNOSIS — F411 Generalized anxiety disorder: Secondary | ICD-10-CM

## 2024-06-05 DIAGNOSIS — F909 Attention-deficit hyperactivity disorder, unspecified type: Secondary | ICD-10-CM

## 2024-06-05 MED ORDER — FLUOXETINE HCL 20 MG PO CAPS: 20.0000 mg | ORAL_CAPSULE | Freq: Every day | ORAL | 2 refills | Status: DC

## 2024-06-05 MED ORDER — AMPHETAMINE-DEXTROAMPHET ER 20 MG PO CP24: 20.0000 mg | ORAL_CAPSULE | Freq: Two times a day (BID) | ORAL | 0 refills | Status: DC

## 2024-06-05 NOTE — Progress Notes (Signed)
 SAN RUA [id number] 1990-12-07 33 y.o.  Virtual Visit via Video Note  I connected with pt @ on 06/05/24 at 10:00 AM EDT by a video enabled telemedicine application and verified that I am speaking with the correct person using two identifiers.   I discussed the limitations of evaluation and management by telemedicine and the availability of in person appointments. The patient expressed understanding and agreed to proceed.  I discussed the assessment and treatment plan with the patient. The patient was provided an opportunity to ask questions and all were answered. The patient agreed with the plan and demonstrated an understanding of the instructions.   The patient was advised to call back or seek an in-person evaluation if the symptoms worsen or if the condition fails to improve as anticipated.  I provided 25 minutes of non-face-to-face time during this encounter.  The patient was located at home.  The provider was located at [facility].   Angeline LOISE Sayers, NP   Subjective:   Patient ID:  Kim Stanton is a 33 y.o. (DOB [birth year]) female.  Chief Complaint: No chief complaint on file.   HPI Kim Stanton presents for follow-up of MDD, GAD and ADHD.  Describes mood today as ok. Pleasant. Tearful at times. Mood symptoms - reports decreased depression - it still comes and goes - more work related. Reports stable interest and motivation. Denies anxiety and irritability. Denies recent panic attacks. Reports worry, rumination and over thinking. Reports mood is stable - for the most part. Stating I feel like I'm doing ok. Feels like the medications work well. Taking medications as prescribed.  Energy levels improved. Active, has a regular exercise routine. Walking all day. Enjoys some usual interests and activities. Single. Lives with daughter, age 21 - shared custody 50/50. Mother local. Spending time with family. Appetite adequate. Weight loss - 153 pounds. Sleep  is variable. Averages 7 hours. Focus and concentration improved. Completing tasks. Managing aspects of household. 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   Denies SI or HI.  Denies AH or VH. Denies self harm. Denies substance use.  Previous medication trial: Adderall XR  Review of Systems:  Review of Systems  Musculoskeletal:  Negative for gait problem.  Neurological:  Negative for tremors.  Psychiatric/Behavioral:         Please refer to HPI    Medications: I have reviewed the patient's current medications.  Current Outpatient Medications  Medication Sig Dispense Refill   amphetamine -dextroamphetamine (ADDERALL XR) 20 MG 24 hr capsule Take 1 capsule (20 mg total) by mouth 2 (two) times daily. 60 capsule 0   amphetamine -dextroamphetamine (ADDERALL XR) 20 MG 24 hr capsule Take 1 capsule (20 mg total) by mouth 2 (two) times daily. 60 capsule 0   amphetamine -dextroamphetamine (ADDERALL XR) 20 MG 24 hr capsule Take 1 capsule (20 mg total) by mouth 2 (two) times daily. 60 capsule 0   etonogestrel  (NEXPLANON ) 68 MG IMPL implant 1 each by Subdermal route once.     FLUoxetine  (PROZAC ) 20 MG capsule Take 1 capsule (20 mg total) by mouth daily. 30 capsule 2   valACYclovir  (VALTREX ) 1000 MG tablet Take 1,000 mg by mouth 2 (two) times daily.     valACYclovir  (VALTREX ) 500 MG tablet Take 1 tablet (500 mg total) by mouth 2 (two) times daily. 6 tablet 0   No current facility-administered medications for this visit.    Medication Side Effects: None  Allergies:  Allergies  Allergen Reactions   Lactose Intolerance (Gi)  Nausea Only and Other (See Comments)    Upsets the stomach    Past Medical History:  Diagnosis Date   Encounter for supervision of normal pregnancy, antepartum 12/21/2018    Nursing Staff Provider Office Location  [facility] Dating   LMP 10/10/2018 Language   English Anatomy US    11/25/2018 Flu Vaccine   Declined 12/21/2018 Genetic Screen  NIPS:   AFP:   First Screen:  Quad:   TDaP  vaccine    Hgb A1C or  GTT Early  Third trimester  Rhogam     LAB RESULTS  Feeding Plan  Breast Blood Type --/--/AB POS Performed at [facility], 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     Family History  Problem Relation Age of Onset   Healthy Mother    Healthy Father    Cancer Maternal Grandmother        Breast   Diabetes Maternal Grandmother     Social History   Socioeconomic History   Marital status: Single    Spouse name: Not on file   Number of children: Not on file   Years of education: Not on file   Highest education level: Not on file  Occupational History   Not on file  Tobacco Use   Smoking status: Some Days    Types: Cigars    Passive exposure: Current   Smokeless tobacco: Never  Vaping Use   Vaping status: Former  Substance and Sexual Activity   Alcohol use: Yes    Alcohol/week: 5.0 standard drinks of alcohol    Types: 5 Standard drinks or equivalent per week    Comment: weekly   Drug use: Never   Sexual activity: Yes    Partners: Male    Birth control/protection: Implant  Other Topics Concern   Not on file  Social History Narrative   Not on file   Social Drivers of Health   Financial Resource Strain: Not on file  Food Insecurity: Not on file  Transportation Needs: Not on file  Physical Activity: Not on file  Stress: Not on file  Social Connections: Not on file  Intimate Partner Violence: Not on file    Past Medical History, Surgical history, Social history, and Family history were reviewed and updated as appropriate.   Please see review of systems for further details on the patient's review from today.   Objective:   Physical Exam:  There were no vitals taken for this visit.  Physical Exam Constitutional:      General: She is not in acute distress. Musculoskeletal:        General: No deformity.  Neurological:     Mental Status: She  is alert and oriented to person, place, and time.     Coordination: Coordination normal.  Psychiatric:        Attention and Perception: Attention and perception normal. She does not perceive auditory or visual hallucinations.        Mood and Affect: Mood normal. Mood is not anxious or depressed. Affect is not labile, blunt, angry or inappropriate.        Speech: Speech normal.        Behavior: Behavior normal.        Thought Content: Thought content normal. Thought content is not paranoid or delusional. Thought content does not include homicidal or suicidal ideation. Thought content does not include homicidal or  suicidal plan.        Cognition and Memory: Cognition and memory normal.        Judgment: Judgment normal.     Comments: Insight intact     Lab Review:     Component Value Date/Time   NA 134 (L) 07/16/2019 1950   NA 137 06/15/2019 1047   K 4.0 07/16/2019 1950   CL 104 07/16/2019 1950   CO2 19 (L) 07/16/2019 1950   GLUCOSE 91 07/16/2019 1950   BUN 14 07/16/2019 1950   BUN 5 (L) 06/15/2019 1047   CREATININE 0.69 07/16/2019 1950   CALCIUM 9.4 07/16/2019 1950   PROT 6.5 07/16/2019 1950   PROT 6.3 06/15/2019 1047   ALBUMIN 3.4 (L) 07/16/2019 1950   ALBUMIN 3.9 06/15/2019 1047   AST 25 07/16/2019 1950   ALT 13 07/16/2019 1950   ALKPHOS 196 (H) 07/16/2019 1950   BILITOT 0.4 07/16/2019 1950   BILITOT <0.2 06/15/2019 1047   GFRNONAA >60 07/16/2019 1950   GFRAA >60 07/16/2019 1950       Component Value Date/Time   WBC 3.8 02/12/2023 1223   WBC 7.0 07/16/2019 1923   RBC 4.70 02/12/2023 1223   RBC 4.27 07/16/2019 1923   HGB 15.3 02/12/2023 1223   HCT 45.3 02/12/2023 1223   PLT 346 02/12/2023 1223   MCV 96 02/12/2023 1223   MCH 32.6 02/12/2023 1223   MCH 30.0 07/16/2019 1923   MCHC 33.8 02/12/2023 1223   MCHC 33.3 07/16/2019 1923   RDW 12.3 02/12/2023 1223   LYMPHSABS 2.1 02/12/2023 1223   MONOABS 0.5 05/24/2018 1249   EOSABS 0.1 02/12/2023 1223   BASOSABS 0.0  02/12/2023 1223    No results found for: POCLITH, LITHIUM   No results found for: PHENYTOIN, PHENOBARB, VALPROATE, CBMZ   .res Assessment: Plan:    Plan:  PDMP reviewed  Adderall XR 20mg  BID Prozac  20mg  daily  Monitor BP between visits.  Read and reviewed note with patient for accuracy.   RTC 3 months  25 minutes spent dedicated to the care of this patient on the date of this encounter to include pre-visit review of records, ordering of medication, post visit documentation, and face-to-face time with the patient discussing depression, anxiety and ADD. Discussed continuing current medication regimen.  Patient advised to contact office with any questions, adverse effects, or acute worsening in signs and symptoms.  Discussed potential benefits, risks, and side effects of stimulants with patient to include increased heart rate, palpitations, insomnia, increased anxiety, increased irritability, or decreased appetite.  Instructed patient to contact office if experiencing any significant tolerability issues.   There are no diagnoses linked to this encounter.   Please see After Visit Summary for patient specific instructions.  No future appointments.  No orders of the defined types were placed in this encounter.     -------------------------------

## 2024-08-10 ENCOUNTER — Ambulatory Visit (INDEPENDENT_AMBULATORY_CARE_PROVIDER_SITE_OTHER): Payer: Self-pay | Admitting: Professional Counselor

## 2024-08-10 DIAGNOSIS — Z0389 Encounter for observation for other suspected diseases and conditions ruled out: Secondary | ICD-10-CM

## 2024-08-10 NOTE — Progress Notes (Signed)
 Patient did not show for scheduled intake appointment. No message.   Almarie Sprang, [facility]

## 2024-09-04 ENCOUNTER — Encounter: Payer: Self-pay | Admitting: Adult Health

## 2024-09-04 ENCOUNTER — Telehealth: Payer: Self-pay | Admitting: Adult Health

## 2024-09-04 DIAGNOSIS — F411 Generalized anxiety disorder: Secondary | ICD-10-CM

## 2024-09-04 DIAGNOSIS — F329 Major depressive disorder, single episode, unspecified: Secondary | ICD-10-CM | POA: Diagnosis not present

## 2024-09-04 DIAGNOSIS — F909 Attention-deficit hyperactivity disorder, unspecified type: Secondary | ICD-10-CM

## 2024-09-04 DIAGNOSIS — F331 Major depressive disorder, recurrent, moderate: Secondary | ICD-10-CM

## 2024-09-04 MED ORDER — AMPHETAMINE-DEXTROAMPHET ER 20 MG PO CP24: 20.0000 mg | ORAL_CAPSULE | Freq: Two times a day (BID) | ORAL | 0 refills | Status: AC

## 2024-09-04 MED ORDER — FLUOXETINE HCL 20 MG PO CAPS: 20.0000 mg | ORAL_CAPSULE | Freq: Every day | ORAL | 2 refills | Status: AC

## 2024-09-04 NOTE — Progress Notes (Signed)
 Kim Stanton [id number] 05/18/1991 33 y.o.  Virtual Visit via Video Note  I connected with pt @ on 09/04/24 at 10:30 AM EST by a video enabled telemedicine application and verified that I am speaking with the correct person using two identifiers.   I discussed the limitations of evaluation and management by telemedicine and the availability of in person appointments. The patient expressed understanding and agreed to proceed.  I discussed the assessment and treatment plan with the patient. The patient was provided an opportunity to ask questions and all were answered. The patient agreed with the plan and demonstrated an understanding of the instructions.   The patient was advised to call back or seek an in-person evaluation if the symptoms worsen or if the condition fails to improve as anticipated.  I provided 20 minutes of non-face-to-face time during this encounter.  The patient was located at home.  The provider was located at [facility].   Angeline LOISE Sayers, NP   Subjective:   Patient ID:  Kim Stanton is a 33 y.o. (DOB [birth year]) female.  Chief Complaint: No chief complaint on file.   HPI Kim Stanton presents for follow-up of MDD, GAD and ADHD.  Describes mood today as ok. Pleasant. Tearful at times. Mood symptoms - reports decreased depression, anxiety and irritability. Reports varying interest and motivation. Denies recent panic attacks. Denies worry and rumination. Reports some over thinking. Reports mood is stable - for the most part. Stating I feel like I'm doing ok for the most part. Feels like the medications work well. Taking medications as prescribed.  Energy levels improved, but varies. Active, has a regular exercise routine. Walking all day. Enjoys some usual interests and activities. Single. Lives with daughter 16 - shared custody 50/50. Mother local. Spending time with family. Appetite adequate. Weight gain - 157 pounds. Sleep is variable.  Averages 7 hours. Focus and concentration improved. Completing tasks. Managing aspects of household. 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   Denies SI or HI.  Denies AH or VH. Denies self harm. Denies substance use.  Previous medication trial: Adderall XR    Review of Systems:  Review of Systems  Medications: I have reviewed the patient's current medications.  Current Outpatient Medications  Medication Sig Dispense Refill   amphetamine -dextroamphetamine (ADDERALL XR) 20 MG 24 hr capsule Take 1 capsule (20 mg total) by mouth 2 (two) times daily. 60 capsule 0   amphetamine -dextroamphetamine (ADDERALL XR) 20 MG 24 hr capsule Take 1 capsule (20 mg total) by mouth 2 (two) times daily. 60 capsule 0   amphetamine -dextroamphetamine (ADDERALL XR) 20 MG 24 hr capsule Take 1 capsule (20 mg total) by mouth 2 (two) times daily. 60 capsule 0   etonogestrel  (NEXPLANON ) 68 MG IMPL implant 1 each by Subdermal route once.     FLUoxetine  (PROZAC ) 20 MG capsule Take 1 capsule (20 mg total) by mouth daily. 30 capsule 2   valACYclovir  (VALTREX ) 1000 MG tablet Take 1,000 mg by mouth 2 (two) times daily.     valACYclovir  (VALTREX ) 500 MG tablet Take 1 tablet (500 mg total) by mouth 2 (two) times daily. 6 tablet 0   No current facility-administered medications for this visit.    Medication Side Effects: None  Allergies:  Allergies  Allergen Reactions   Lactose Intolerance (Gi) Nausea Only and Other (See Comments)    Upsets the stomach    Past Medical History:  Diagnosis Date   Encounter for supervision of normal pregnancy, antepartum 12/21/2018  Nursing Staff Provider Office Location  [facility] Dating   LMP 10/10/2018 Language   English Anatomy US    11/25/2018 Flu Vaccine   Declined 12/21/2018 Genetic Screen  NIPS:   AFP:   First Screen:  Quad:   TDaP vaccine    Hgb A1C or  GTT Early  Third trimester  Rhogam     LAB RESULTS  Feeding Plan  Breast Blood Type --/--/AB POS Performed at [facility]  Lab, 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     Family History  Problem Relation Age of Onset   Healthy Mother    Healthy Father    Cancer Maternal Grandmother        Breast   Diabetes Maternal Grandmother     Social History   Socioeconomic History   Marital status: Single    Spouse name: Not on file   Number of children: Not on file   Years of education: Not on file   Highest education level: Not on file  Occupational History   Not on file  Tobacco Use   Smoking status: Some Days    Types: Cigars    Passive exposure: Current   Smokeless tobacco: Never  Vaping Use   Vaping status: Former  Substance and Sexual Activity   Alcohol use: Yes    Alcohol/week: 5.0 standard drinks of alcohol    Types: 5 Standard drinks or equivalent per week    Comment: weekly   Drug use: Never   Sexual activity: Yes    Partners: Male    Birth control/protection: Implant  Other Topics Concern   Not on file  Social History Narrative   Not on file   Social Drivers of Health   Financial Resource Strain: Not on file  Food Insecurity: Not on file  Transportation Needs: Not on file  Physical Activity: Not on file  Stress: Not on file  Social Connections: Not on file  Intimate Partner Violence: Not on file    Past Medical History, Surgical history, Social history, and Family history were reviewed and updated as appropriate.   Please see review of systems for further details on the patient's review from today.   Objective:   Physical Exam:  There were no vitals taken for this visit.  Physical Exam Constitutional:      General: She is not in acute distress. Musculoskeletal:        General: No deformity.  Neurological:     Mental Status: She is alert and oriented to person, place, and time.     Coordination: Coordination normal.  Psychiatric:        Attention and Perception: Attention and  perception normal. She does not perceive auditory or visual hallucinations.        Mood and Affect: Mood normal. Mood is not anxious or depressed. Affect is not labile, blunt, angry or inappropriate.        Speech: Speech normal.        Behavior: Behavior normal.        Thought Content: Thought content normal. Thought content is not paranoid or delusional. Thought content does not include homicidal or suicidal ideation. Thought content does not include homicidal or suicidal plan.        Cognition and Memory: Cognition and memory normal.        Judgment: Judgment normal.     Comments: Insight intact  Lab Review:     Component Value Date/Time   NA 134 (L) 07/16/2019 1950   NA 137 06/15/2019 1047   K 4.0 07/16/2019 1950   CL 104 07/16/2019 1950   CO2 19 (L) 07/16/2019 1950   GLUCOSE 91 07/16/2019 1950   BUN 14 07/16/2019 1950   BUN 5 (L) 06/15/2019 1047   CREATININE 0.69 07/16/2019 1950   CALCIUM 9.4 07/16/2019 1950   PROT 6.5 07/16/2019 1950   PROT 6.3 06/15/2019 1047   ALBUMIN 3.4 (L) 07/16/2019 1950   ALBUMIN 3.9 06/15/2019 1047   AST 25 07/16/2019 1950   ALT 13 07/16/2019 1950   ALKPHOS 196 (H) 07/16/2019 1950   BILITOT 0.4 07/16/2019 1950   BILITOT <0.2 06/15/2019 1047   GFRNONAA >60 07/16/2019 1950   GFRAA >60 07/16/2019 1950       Component Value Date/Time   WBC 3.8 02/12/2023 1223   WBC 7.0 07/16/2019 1923   RBC 4.70 02/12/2023 1223   RBC 4.27 07/16/2019 1923   HGB 15.3 02/12/2023 1223   HCT 45.3 02/12/2023 1223   PLT 346 02/12/2023 1223   MCV 96 02/12/2023 1223   MCH 32.6 02/12/2023 1223   MCH 30.0 07/16/2019 1923   MCHC 33.8 02/12/2023 1223   MCHC 33.3 07/16/2019 1923   RDW 12.3 02/12/2023 1223   LYMPHSABS 2.1 02/12/2023 1223   MONOABS 0.5 05/24/2018 1249   EOSABS 0.1 02/12/2023 1223   BASOSABS 0.0 02/12/2023 1223    No results found for: POCLITH, LITHIUM   No results found for: PHENYTOIN, PHENOBARB, VALPROATE, CBMZ    .res Assessment: Plan:    Plan:  PDMP reviewed  Adderall XR 20mg  BID Prozac  20mg  daily  Monitor BP between visits.  Read and reviewed note with patient for accuracy.   RTC 3 months  20 minutes spent dedicated to the care of this patient on the date of this encounter to include pre-visit review of records, ordering of medication, post visit documentation, and face-to-face time with the patient discussing depression, anxiety and ADD. Discussed continuing current medication regimen.  Patient advised to contact office with any questions, adverse effects, or acute worsening in signs and symptoms.  Discussed potential benefits, risks, and side effects of stimulants with patient to include increased heart rate, palpitations, insomnia, increased anxiety, increased irritability, or decreased appetite.  Instructed patient to contact office if experiencing any significant tolerability issues.  There are no diagnoses linked to this encounter.   Please see After Visit Summary for patient specific instructions.  Future Appointments  Date Time Provider Department Center  09/04/2024 10:30 AM Brettany Sydney Nattalie, NP [facility] None    No orders of the defined types were placed in this encounter.     -------------------------------

## 2024-09-16 ENCOUNTER — Ambulatory Visit
Admission: RE | Admit: 2024-09-16 | Discharge: 2024-09-16 | Disposition: A | Discharge status: Home / Self Care | Payer: Self-pay | Source: Ambulatory Visit | Attending: Internal Medicine | Admitting: Internal Medicine

## 2024-09-16 VITALS — BP 134/82 | HR 97 | Temp 97.6°F | Resp 18

## 2024-09-16 DIAGNOSIS — R0981 Nasal congestion: Secondary | ICD-10-CM

## 2024-09-16 DIAGNOSIS — R509 Fever, unspecified: Secondary | ICD-10-CM

## 2024-09-16 DIAGNOSIS — M791 Myalgia, unspecified site: Secondary | ICD-10-CM

## 2024-09-16 DIAGNOSIS — R051 Acute cough: Secondary | ICD-10-CM

## 2024-09-16 LAB — POC COVID19/FLU A&B COMBO
Covid Antigen, POC: NEGATIVE
Influenza A Antigen, POC: NEGATIVE
Influenza B Antigen, POC: NEGATIVE

## 2024-09-16 MED ORDER — BENZONATATE 200 MG PO CAPS: 200.0000 mg | ORAL_CAPSULE | Freq: Three times a day (TID) | ORAL | 0 refills | Status: AC | PRN

## 2024-09-16 MED ORDER — FLUTICASONE PROPIONATE 50 MCG/ACT NA SUSP: 1.0000 | Freq: Every day | NASAL | 0 refills | Status: AC | PRN

## 2024-09-16 NOTE — Discharge Instructions (Addendum)
 Your flu and COVID tests were negative. Your symptoms appear consistent with a viral respiratory illness. Recommend symptomatic treatment with nasal spray (Flonase ) for congestion and Tylenol /Ibuprofen  as needed for fevers/aches.   I have also sent you a cough medicine as well. Please take as directed.   Return in 3-4 days if no improvement. If new or worsening symptoms such as presistent fevers, difficulty breathing, shortness of breath, or chest pain, go directly to the ER.

## 2024-09-16 NOTE — ED Provider Notes (Signed)
 " [facility]  Note:  This document was prepared using Dragon voice recognition software and may include unintentional dictation errors.  MRN: [id number] DOB: 1991-07-14 DATE: 09/16/2024   Subjective:  Chief Complaint:  Chief Complaint  Patient presents with   Influenza    Entered by patient   Facial Pain   Cough   Fever     HPI: Kim Stanton is a 33 y.o. female presenting for fever and cough for the past 3-4 days. Patient states she was with her boyfriend on Wednesday and felt fine. She states the next day she woke up with general malaise and myalgias. She reports feeling feverish and having sweats. She states she has developed a cough as well as congestion and sore throat. She reports taking OTC cold and flu medicine as well as ibuprofen  with some relief. No known sick contacts, but boyfriend is now sick as well. Reports max fever of 102.3 early this morning. Denies nausea/vomiting, abdominal pain, otalgia. Endorses fever, sweats, myalgias, cough, congestion. Presents NAD.  Prior to Admission medications  Medication Sig Start Date End Date Taking? Authorizing Provider  amphetamine -dextroamphetamine (ADDERALL XR) 20 MG 24 hr capsule Take 1 capsule (20 mg total) by mouth 2 (two) times daily. 10/30/24   Mozingo, Regina Nattalie, NP  amphetamine -dextroamphetamine (ADDERALL XR) 20 MG 24 hr capsule Take 1 capsule (20 mg total) by mouth 2 (two) times daily. 10/02/24   Mozingo, Regina Nattalie, NP  amphetamine -dextroamphetamine (ADDERALL XR) 20 MG 24 hr capsule Take 1 capsule (20 mg total) by mouth 2 (two) times daily. 09/04/24   Mozingo, Regina Nattalie, NP  etonogestrel  (NEXPLANON ) 68 MG IMPL implant 1 each by Subdermal route once.    [provider]  FLUoxetine  (PROZAC ) 20 MG capsule Take 1 capsule (20 mg total) by mouth daily. 09/04/24   Mozingo, Regina Nattalie, NP  valACYclovir  (VALTREX ) 1000 MG tablet Take 1,000 mg by mouth 2 (two) times daily.    [provider]   valACYclovir  (VALTREX ) 500 MG tablet Take 1 tablet (500 mg total) by mouth 2 (two) times daily. 05/31/24   Fredirick Glenys RAMAN, MD     Allergies[1]  History:   Past Medical History:  Diagnosis Date   Encounter for supervision of normal pregnancy, antepartum 12/21/2018    Nursing Staff Provider Office Location  [facility] Dating   LMP 10/10/2018 Language   English Anatomy US    11/25/2018 Flu Vaccine   Declined 12/21/2018 Genetic Screen  NIPS:   AFP:   First Screen:  Quad:   TDaP vaccine    Hgb A1C or  GTT Early  Third trimester  Rhogam     LAB RESULTS  Feeding Plan  Breast Blood Type --/--/AB POS Performed at [facility], 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  HENT:  Positive for congestion, rhinorrhea, sinus pressure and sore throat. Negative for ear pain.   Respiratory:  Positive for cough.   Gastrointestinal:  Negative for abdominal pain, nausea and vomiting.  Musculoskeletal:  Positive for myalgias.  Neurological:  Positive for headaches.     Objective:   Vitals: BP 134/82 (BP Location: Left Arm)   Pulse 97   Temp 97.6 F (36.4 C) (Oral)   Resp 18   LMP 09/09/2024   SpO2 98%   Physical Exam Constitutional:      General: She is not in acute distress.    Appearance: Normal appearance. She is well-developed and overweight. She is not ill-appearing or toxic-appearing.  HENT:     Head: Normocephalic and atraumatic.     Right Ear: Ear canal normal. A middle ear effusion is present.     Left Ear: Ear canal normal. A  middle ear effusion is present.     Nose: Rhinorrhea present. Rhinorrhea is clear.     Mouth/Throat:     Pharynx: Uvula midline. Posterior oropharyngeal erythema present. No pharyngeal swelling or oropharyngeal exudate.     Tonsils: No tonsillar exudate or tonsillar abscesses.  Cardiovascular:     Rate and Rhythm: Normal rate and regular rhythm.     Heart sounds: Normal heart sounds.  Pulmonary:     Effort: Pulmonary effort is normal.     Breath sounds: Normal breath sounds.     Comments: Clear to auscultation bilaterally  Abdominal:     General: Bowel sounds are normal.     Palpations: Abdomen is soft.     Tenderness: There is no abdominal tenderness.  Skin:    General: Skin is warm and dry.  Neurological:     General: No focal deficit present.     Mental Status: She is alert.  Psychiatric:        Mood and Affect: Mood and affect normal.     Results:  Labs: Results for orders placed or performed during the hospital encounter of 09/16/24 (from the past 24 hours)  POC Covid19/Flu A&B Antigen     Status: Normal   Collection Time: 09/16/24 11:38 AM  Result Value Ref Range   Influenza A Antigen, POC Negative Negative   Influenza B Antigen, POC Negative Negative   Covid Antigen, POC Negative Negative    Radiology: No results found.   UC Course/Treatments:  Procedures: Procedures   Medications Ordered in UC: Medications - No data to display   Assessment and Plan :     ICD-10-CM   1. Fever, unspecified  R50.9     2. Acute cough  R05.1 POC Covid19/Flu A&B Antigen    POC Covid19/Flu A&B Antigen    3. Myalgia  M79.10     4. Nasal congestion  R09.81      Fever, unspecified Acute cough Myalgia Nasal congestion Afebrile, nontoxic-appearing, NAD. VSS. DDX includes but not limited to: COVID, flu, viral URI, sinusitis, allergic rhinitis COVID and flu were negative. Suspect viral etiology. Recommend symptomatic treatment. Flonase  1 spray each nostril every day PRN  was prescribed for congestion and post nasal drip as well as Benzonatate  200mg  TID PRN was prescribed for cough. Encouraged rest and fluids. Strict ED precautions were given and patient verbalized understanding.  ED Discharge Orders          Ordered    benzonatate  (TESSALON ) 200 MG capsule  3 times daily PRN        09/16/24 1143    fluticasone  (FLONASE ) 50 MCG/ACT nasal spray  Daily PRN        09/16/24 1143  PDMP not reviewed this encounter.      [1]  Allergies Allergen Reactions   Lactose Intolerance (Gi) Nausea Only and Other (See Comments)    Upsets the stomach  [2]  Social History Tobacco Use   Smoking status: Some Days    Types: Cigars    Passive exposure: Current   Smokeless tobacco: Never  Vaping Use   Vaping status: Former  Substance Use Topics   Alcohol use: Yes    Alcohol/week: 5.0 standard drinks of alcohol    Types: 5 Standard drinks or equivalent per week    Comment: weekly   Drug use: Never     Shonteria Abeln P, PA-C 09/16/24 1145  "

## 2024-09-16 NOTE — ED Triage Notes (Signed)
 Patient reports 3-day h/o cough,fever and sinus pressure. Denies any vomiting or diarrhea.
